# Patient Record
Sex: Female | Born: 1980 | Race: White | Hispanic: No | Marital: Married | Smoking: Former smoker
Health system: Southern US, Community
[De-identification: ages and names within clinical notes are randomized; demographics above are authoritative.]

## PROBLEM LIST (undated history)

## (undated) DIAGNOSIS — R87619 Unspecified abnormal cytological findings in specimens from cervix uteri: Secondary | ICD-10-CM

## (undated) DIAGNOSIS — T7840XA Allergy, unspecified, initial encounter: Secondary | ICD-10-CM

## (undated) DIAGNOSIS — M1990 Unspecified osteoarthritis, unspecified site: Secondary | ICD-10-CM

## (undated) DIAGNOSIS — E079 Disorder of thyroid, unspecified: Secondary | ICD-10-CM

## (undated) DIAGNOSIS — F1910 Other psychoactive substance abuse, uncomplicated: Secondary | ICD-10-CM

## (undated) DIAGNOSIS — F419 Anxiety disorder, unspecified: Secondary | ICD-10-CM

## (undated) DIAGNOSIS — IMO0002 Reserved for concepts with insufficient information to code with codable children: Secondary | ICD-10-CM

## (undated) DIAGNOSIS — K219 Gastro-esophageal reflux disease without esophagitis: Secondary | ICD-10-CM

## (undated) DIAGNOSIS — J449 Chronic obstructive pulmonary disease, unspecified: Secondary | ICD-10-CM

## (undated) HISTORY — DX: Unspecified osteoarthritis, unspecified site: M19.90

## (undated) HISTORY — DX: Other psychoactive substance abuse, uncomplicated: F19.10

## (undated) HISTORY — PX: TUBAL LIGATION: SHX77

## (undated) HISTORY — PX: DILATION AND CURETTAGE OF UTERUS: SHX78

## (undated) HISTORY — DX: Allergy, unspecified, initial encounter: T78.40XA

## (undated) HISTORY — DX: Anxiety disorder, unspecified: F41.9

## (undated) HISTORY — PX: DILATION AND EVACUATION: SHX1459

## (undated) HISTORY — DX: Unspecified abnormal cytological findings in specimens from cervix uteri: R87.619

## (undated) HISTORY — PX: LEEP: SHX91

## (undated) HISTORY — DX: Disorder of thyroid, unspecified: E07.9

## (undated) HISTORY — DX: Reserved for concepts with insufficient information to code with codable children: IMO0002

## (undated) HISTORY — DX: Chronic obstructive pulmonary disease, unspecified: J44.9

## (undated) HISTORY — DX: Gastro-esophageal reflux disease without esophagitis: K21.9

## (undated) HISTORY — PX: WISDOM TOOTH EXTRACTION: SHX21

---

## 2003-11-15 ENCOUNTER — Ambulatory Visit: Payer: Self-pay | Admitting: Internal Medicine

## 2004-02-24 ENCOUNTER — Emergency Department: Payer: Self-pay | Admitting: Emergency Medicine

## 2004-06-06 ENCOUNTER — Emergency Department: Payer: Self-pay | Admitting: Emergency Medicine

## 2004-06-12 ENCOUNTER — Emergency Department: Payer: Self-pay | Admitting: Emergency Medicine

## 2004-07-29 ENCOUNTER — Emergency Department: Payer: Self-pay | Admitting: General Practice

## 2004-10-31 ENCOUNTER — Emergency Department: Payer: Self-pay | Admitting: Emergency Medicine

## 2005-04-09 ENCOUNTER — Emergency Department: Payer: Self-pay | Admitting: Emergency Medicine

## 2005-05-30 ENCOUNTER — Emergency Department: Payer: Self-pay | Admitting: Emergency Medicine

## 2005-11-10 ENCOUNTER — Emergency Department: Payer: Self-pay | Admitting: Emergency Medicine

## 2005-11-10 ENCOUNTER — Emergency Department: Payer: Self-pay

## 2006-02-01 ENCOUNTER — Emergency Department: Payer: Self-pay | Admitting: Emergency Medicine

## 2006-02-02 ENCOUNTER — Emergency Department: Payer: Self-pay | Admitting: Internal Medicine

## 2006-02-17 ENCOUNTER — Emergency Department: Payer: Self-pay | Admitting: Emergency Medicine

## 2006-07-31 ENCOUNTER — Emergency Department: Payer: Self-pay

## 2006-10-02 ENCOUNTER — Ambulatory Visit: Payer: Self-pay | Admitting: Gynecology

## 2006-10-06 ENCOUNTER — Ambulatory Visit (HOSPITAL_COMMUNITY): Admission: RE | Admit: 2006-10-06 | Discharge: 2006-10-06 | Payer: Self-pay | Admitting: Gynecology

## 2006-10-22 ENCOUNTER — Encounter: Payer: Self-pay | Admitting: Obstetrics & Gynecology

## 2006-10-22 ENCOUNTER — Ambulatory Visit: Payer: Self-pay | Admitting: Obstetrics & Gynecology

## 2006-11-17 ENCOUNTER — Ambulatory Visit (HOSPITAL_COMMUNITY): Admission: RE | Admit: 2006-11-17 | Discharge: 2006-11-17 | Payer: Self-pay | Admitting: Gynecology

## 2006-12-10 ENCOUNTER — Ambulatory Visit: Payer: Self-pay | Admitting: Obstetrics & Gynecology

## 2007-01-13 ENCOUNTER — Ambulatory Visit: Payer: Self-pay | Admitting: Family Medicine

## 2007-01-13 LAB — CONVERTED CEMR LAB
HCT: 32.4 % — ABNORMAL LOW (ref 36.0–46.0)
Hemoglobin: 10.8 g/dL — ABNORMAL LOW (ref 12.0–15.0)
MCHC: 33.3 g/dL (ref 30.0–36.0)
Platelets: 216 10*3/uL (ref 150–400)
RDW: 12.8 % (ref 11.5–15.5)

## 2007-01-28 ENCOUNTER — Ambulatory Visit: Payer: Self-pay | Admitting: Obstetrics & Gynecology

## 2007-02-16 ENCOUNTER — Ambulatory Visit: Payer: Self-pay | Admitting: Gynecology

## 2007-03-05 ENCOUNTER — Ambulatory Visit: Payer: Self-pay | Admitting: Obstetrics & Gynecology

## 2007-03-09 ENCOUNTER — Inpatient Hospital Stay (HOSPITAL_COMMUNITY): Admission: AD | Admit: 2007-03-09 | Discharge: 2007-03-09 | Payer: Self-pay | Admitting: Obstetrics & Gynecology

## 2007-03-09 ENCOUNTER — Ambulatory Visit: Payer: Self-pay | Admitting: Obstetrics and Gynecology

## 2007-03-10 ENCOUNTER — Ambulatory Visit (HOSPITAL_COMMUNITY): Admission: RE | Admit: 2007-03-10 | Discharge: 2007-03-10 | Payer: Self-pay | Admitting: Gynecology

## 2007-04-01 ENCOUNTER — Ambulatory Visit: Payer: Self-pay | Admitting: Gynecology

## 2007-04-09 ENCOUNTER — Ambulatory Visit: Payer: Self-pay | Admitting: Obstetrics & Gynecology

## 2007-04-10 ENCOUNTER — Encounter: Payer: Self-pay | Admitting: Obstetrics and Gynecology

## 2007-04-10 ENCOUNTER — Inpatient Hospital Stay (HOSPITAL_COMMUNITY): Admission: RE | Admit: 2007-04-10 | Discharge: 2007-04-12 | Payer: Self-pay | Admitting: Obstetrics and Gynecology

## 2007-04-10 ENCOUNTER — Ambulatory Visit: Payer: Self-pay | Admitting: Obstetrics and Gynecology

## 2007-04-15 ENCOUNTER — Ambulatory Visit: Payer: Self-pay | Admitting: Obstetrics & Gynecology

## 2007-04-20 ENCOUNTER — Ambulatory Visit: Payer: Self-pay | Admitting: Obstetrics & Gynecology

## 2007-04-28 ENCOUNTER — Ambulatory Visit: Payer: Self-pay | Admitting: Family Medicine

## 2007-05-27 ENCOUNTER — Ambulatory Visit: Payer: Self-pay | Admitting: Obstetrics & Gynecology

## 2007-08-10 ENCOUNTER — Ambulatory Visit: Payer: Self-pay | Admitting: Gynecology

## 2007-08-17 ENCOUNTER — Ambulatory Visit (HOSPITAL_COMMUNITY): Admission: RE | Admit: 2007-08-17 | Discharge: 2007-08-17 | Payer: Self-pay | Admitting: Gynecology

## 2007-08-24 ENCOUNTER — Ambulatory Visit (HOSPITAL_COMMUNITY): Admission: RE | Admit: 2007-08-24 | Discharge: 2007-08-24 | Payer: Self-pay | Admitting: Gynecology

## 2007-08-24 ENCOUNTER — Ambulatory Visit: Payer: Self-pay | Admitting: Family Medicine

## 2007-08-25 ENCOUNTER — Ambulatory Visit: Payer: Self-pay | Admitting: Obstetrics and Gynecology

## 2007-08-27 ENCOUNTER — Ambulatory Visit: Payer: Self-pay | Admitting: Obstetrics & Gynecology

## 2007-08-27 ENCOUNTER — Inpatient Hospital Stay (HOSPITAL_COMMUNITY): Admission: AD | Admit: 2007-08-27 | Discharge: 2007-08-27 | Payer: Self-pay | Admitting: Obstetrics & Gynecology

## 2007-08-29 ENCOUNTER — Ambulatory Visit: Payer: Self-pay | Admitting: Obstetrics & Gynecology

## 2007-08-29 ENCOUNTER — Ambulatory Visit (HOSPITAL_COMMUNITY): Admission: RE | Admit: 2007-08-29 | Discharge: 2007-08-29 | Payer: Self-pay | Admitting: Obstetrics & Gynecology

## 2007-08-29 ENCOUNTER — Encounter: Payer: Self-pay | Admitting: Obstetrics & Gynecology

## 2007-09-16 ENCOUNTER — Ambulatory Visit: Payer: Self-pay | Admitting: Obstetrics and Gynecology

## 2007-10-08 ENCOUNTER — Ambulatory Visit: Payer: Self-pay | Admitting: Obstetrics & Gynecology

## 2007-10-12 ENCOUNTER — Inpatient Hospital Stay (HOSPITAL_COMMUNITY): Admission: AD | Admit: 2007-10-12 | Discharge: 2007-10-12 | Payer: Self-pay | Admitting: Gynecology

## 2007-10-13 ENCOUNTER — Ambulatory Visit: Payer: Self-pay | Admitting: Obstetrics & Gynecology

## 2007-11-18 ENCOUNTER — Ambulatory Visit: Payer: Self-pay | Admitting: Obstetrics and Gynecology

## 2007-11-18 ENCOUNTER — Encounter: Payer: Self-pay | Admitting: Obstetrics and Gynecology

## 2007-11-18 ENCOUNTER — Other Ambulatory Visit: Admission: RE | Admit: 2007-11-18 | Discharge: 2007-11-18 | Payer: Self-pay | Admitting: Obstetrics & Gynecology

## 2008-02-02 ENCOUNTER — Inpatient Hospital Stay (HOSPITAL_COMMUNITY): Admission: AD | Admit: 2008-02-02 | Discharge: 2008-02-02 | Payer: Self-pay | Admitting: Obstetrics & Gynecology

## 2008-03-31 ENCOUNTER — Ambulatory Visit: Payer: Self-pay | Admitting: Internal Medicine

## 2008-04-02 ENCOUNTER — Emergency Department: Payer: Self-pay | Admitting: Emergency Medicine

## 2008-09-09 ENCOUNTER — Ambulatory Visit: Payer: Self-pay | Admitting: Internal Medicine

## 2008-10-05 ENCOUNTER — Encounter: Payer: Self-pay | Admitting: Internal Medicine

## 2008-10-21 ENCOUNTER — Encounter: Payer: Self-pay | Admitting: Internal Medicine

## 2008-11-10 ENCOUNTER — Other Ambulatory Visit: Admission: RE | Admit: 2008-11-10 | Discharge: 2008-11-10 | Payer: Self-pay | Admitting: Obstetrics and Gynecology

## 2008-11-10 ENCOUNTER — Ambulatory Visit: Payer: Self-pay | Admitting: Obstetrics and Gynecology

## 2008-11-10 ENCOUNTER — Encounter: Payer: Self-pay | Admitting: Obstetrics and Gynecology

## 2008-11-21 ENCOUNTER — Encounter: Payer: Self-pay | Admitting: Internal Medicine

## 2008-11-24 ENCOUNTER — Ambulatory Visit: Payer: Self-pay | Admitting: Gastroenterology

## 2009-02-09 ENCOUNTER — Encounter: Payer: Self-pay | Admitting: Family Medicine

## 2009-02-09 ENCOUNTER — Ambulatory Visit: Payer: Self-pay | Admitting: Obstetrics & Gynecology

## 2009-02-09 LAB — CONVERTED CEMR LAB
Antibody Screen: NEGATIVE
Basophils Absolute: 0 10*3/uL (ref 0.0–0.1)
Basophils Relative: 0 % (ref 0–1)
Eosinophils Absolute: 0.1 10*3/uL (ref 0.0–0.7)
Eosinophils Relative: 1 % (ref 0–5)
HCT: 39.1 % (ref 36.0–46.0)
MCV: 93.3 fL (ref 78.0–100.0)
Neutrophils Relative %: 72 % (ref 43–77)
Platelets: 245 10*3/uL (ref 150–400)
RDW: 12.8 % (ref 11.5–15.5)
WBC: 10.8 10*3/uL — ABNORMAL HIGH (ref 4.0–10.5)

## 2009-02-21 ENCOUNTER — Other Ambulatory Visit: Admission: RE | Admit: 2009-02-21 | Discharge: 2009-02-21 | Payer: Self-pay | Admitting: Obstetrics & Gynecology

## 2009-02-21 ENCOUNTER — Ambulatory Visit: Payer: Self-pay | Admitting: Obstetrics & Gynecology

## 2009-03-10 ENCOUNTER — Emergency Department: Payer: Self-pay | Admitting: Emergency Medicine

## 2009-03-23 ENCOUNTER — Ambulatory Visit (HOSPITAL_COMMUNITY): Admission: RE | Admit: 2009-03-23 | Discharge: 2009-03-23 | Payer: Self-pay | Admitting: Obstetrics & Gynecology

## 2009-03-29 ENCOUNTER — Ambulatory Visit: Payer: Self-pay | Admitting: Obstetrics & Gynecology

## 2009-04-19 ENCOUNTER — Encounter: Payer: Self-pay | Admitting: Family Medicine

## 2009-04-19 ENCOUNTER — Ambulatory Visit: Payer: Self-pay | Admitting: Obstetrics & Gynecology

## 2009-04-19 LAB — CONVERTED CEMR LAB
Chlamydia, DNA Probe: NEGATIVE
GC Probe Amp, Genital: NEGATIVE

## 2009-04-20 ENCOUNTER — Encounter: Payer: Self-pay | Admitting: Family Medicine

## 2009-04-20 LAB — CONVERTED CEMR LAB: Trich, Wet Prep: NONE SEEN

## 2009-04-27 ENCOUNTER — Ambulatory Visit: Payer: Self-pay | Admitting: Obstetrics & Gynecology

## 2009-05-03 ENCOUNTER — Ambulatory Visit (HOSPITAL_COMMUNITY): Admission: RE | Admit: 2009-05-03 | Discharge: 2009-05-03 | Payer: Self-pay | Admitting: Obstetrics & Gynecology

## 2009-05-04 ENCOUNTER — Ambulatory Visit: Payer: Self-pay | Admitting: Obstetrics & Gynecology

## 2009-05-24 ENCOUNTER — Ambulatory Visit: Payer: Self-pay | Admitting: Obstetrics and Gynecology

## 2009-06-21 ENCOUNTER — Ambulatory Visit: Payer: Self-pay | Admitting: Family Medicine

## 2009-07-19 ENCOUNTER — Encounter: Payer: Self-pay | Admitting: Family Medicine

## 2009-07-19 ENCOUNTER — Ambulatory Visit: Payer: Self-pay | Admitting: Obstetrics & Gynecology

## 2009-07-19 LAB — CONVERTED CEMR LAB
HCT: 34.2 % — ABNORMAL LOW (ref 36.0–46.0)
Hemoglobin: 10.7 g/dL — ABNORMAL LOW (ref 12.0–15.0)
Platelets: 216 10*3/uL (ref 150–400)
RBC: 3.5 M/uL — ABNORMAL LOW (ref 3.87–5.11)
WBC: 12.3 10*3/uL — ABNORMAL HIGH (ref 4.0–10.5)

## 2009-07-20 ENCOUNTER — Encounter: Payer: Self-pay | Admitting: Family Medicine

## 2009-07-20 LAB — CONVERTED CEMR LAB

## 2009-08-02 ENCOUNTER — Ambulatory Visit (HOSPITAL_COMMUNITY): Admission: RE | Admit: 2009-08-02 | Discharge: 2009-08-02 | Payer: Self-pay | Admitting: Obstetrics & Gynecology

## 2009-08-10 ENCOUNTER — Ambulatory Visit: Payer: Self-pay | Admitting: Obstetrics and Gynecology

## 2009-08-31 ENCOUNTER — Ambulatory Visit: Payer: Self-pay | Admitting: Obstetrics & Gynecology

## 2009-09-06 ENCOUNTER — Ambulatory Visit: Payer: Self-pay | Admitting: Obstetrics and Gynecology

## 2009-09-06 ENCOUNTER — Encounter: Payer: Self-pay | Admitting: Family Medicine

## 2009-09-06 LAB — CONVERTED CEMR LAB
Chlamydia, DNA Probe: NEGATIVE
GC Probe Amp, Genital: NEGATIVE

## 2009-09-07 ENCOUNTER — Encounter: Payer: Self-pay | Admitting: Family Medicine

## 2009-09-14 ENCOUNTER — Ambulatory Visit: Payer: Self-pay | Admitting: Obstetrics & Gynecology

## 2009-09-20 ENCOUNTER — Ambulatory Visit: Payer: Self-pay | Admitting: Obstetrics & Gynecology

## 2009-09-26 ENCOUNTER — Ambulatory Visit: Payer: Self-pay | Admitting: Obstetrics & Gynecology

## 2009-09-26 ENCOUNTER — Inpatient Hospital Stay (HOSPITAL_COMMUNITY): Admission: RE | Admit: 2009-09-26 | Discharge: 2009-09-28 | Payer: Self-pay | Admitting: Obstetrics & Gynecology

## 2009-09-30 ENCOUNTER — Inpatient Hospital Stay (HOSPITAL_COMMUNITY): Admission: AD | Admit: 2009-09-30 | Discharge: 2009-09-30 | Payer: Self-pay | Admitting: Family Medicine

## 2009-11-07 ENCOUNTER — Ambulatory Visit: Payer: Self-pay | Admitting: Family Medicine

## 2009-11-14 ENCOUNTER — Ambulatory Visit: Payer: Self-pay | Admitting: Obstetrics & Gynecology

## 2009-12-12 ENCOUNTER — Ambulatory Visit: Payer: Self-pay | Admitting: Family Medicine

## 2010-02-12 ENCOUNTER — Ambulatory Visit: Admit: 2010-02-12 | Payer: Self-pay | Admitting: Obstetrics & Gynecology

## 2010-03-06 ENCOUNTER — Ambulatory Visit: Payer: Medicaid Other | Admitting: Obstetrics and Gynecology

## 2010-03-06 ENCOUNTER — Other Ambulatory Visit (HOSPITAL_COMMUNITY)
Admission: RE | Admit: 2010-03-06 | Discharge: 2010-03-06 | Disposition: A | Discharge status: Home / Self Care | Payer: Medicaid Other | Source: Ambulatory Visit | Attending: Family Medicine | Admitting: Family Medicine

## 2010-03-06 DIAGNOSIS — Z01419 Encounter for gynecological examination (general) (routine) without abnormal findings: Secondary | ICD-10-CM

## 2010-03-06 DIAGNOSIS — Z30432 Encounter for removal of intrauterine contraceptive device: Secondary | ICD-10-CM

## 2010-04-05 LAB — CBC
Hemoglobin: 10.3 g/dL — ABNORMAL LOW (ref 12.0–15.0)
MCH: 32.8 pg (ref 26.0–34.0)
MCHC: 34.1 g/dL (ref 30.0–36.0)
RDW: 13.2 % (ref 11.5–15.5)

## 2010-04-05 LAB — TYPE AND SCREEN: ABO/RH(D): A POS

## 2010-04-06 LAB — URINALYSIS, ROUTINE W REFLEX MICROSCOPIC
Bilirubin Urine: NEGATIVE
Glucose, UA: NEGATIVE mg/dL
Ketones, ur: NEGATIVE mg/dL
pH: 6.5 (ref 5.0–8.0)

## 2010-04-06 LAB — CBC
HCT: 35.4 % — ABNORMAL LOW (ref 36.0–46.0)
Hemoglobin: 11.9 g/dL — ABNORMAL LOW (ref 12.0–15.0)
MCH: 32.6 pg (ref 26.0–34.0)
MCHC: 33.6 g/dL (ref 30.0–36.0)

## 2010-04-06 LAB — SURGICAL PCR SCREEN: Staphylococcus aureus: POSITIVE — AB

## 2010-04-13 NOTE — Assessment & Plan Note (Signed)
NAME:  [name], [name] NO.:  [id number]  MEDICAL RECORD NO.:  [id number]           PATIENT TYPE:  LOCATION:  [facility] at [facility]           FACILITY:  PHYSICIAN:  Tinnie Gens, MD        DATE OF BIRTH:  1981/01/03  DATE OF SERVICE:  03/06/2010                                 CLINIC NOTE  CHIEF COMPLAINT:  Yearly exam with IUD check.  HISTORY OF PRESENT ILLNESS:  The patient is a 30 year old gravida 6, para 3-0-3-3 who had an IUD placed in October and continued to have fairly significant painful cramping with her periods.  She would like to have her IUD removed.  She does not want to get pregnant.  She is not interested in any other sort of birth control, all of which were reviewed with her today.  The patient additionally was strongly opposed to  [facility] and Depo-Provera.  PAST MEDICAL HISTORY:  Significant for obesity.  PAST SURGICAL HISTORY:  C-section x3.  MEDICATIONS:  She is on Nexium b.i.d., multivitamin daily, and Mirena IUD.  ALLERGIES:  SULFA.  No latex allergy.  SOCIAL HISTORY:  Positive tobacco.  No drugs.  Rare alcohol use.  FAMILY HISTORY:  Essentially negative.  OBSTETRICAL HISTORY:  She is a G6, P3 with 3 previous C-sections and 3 miscarriages.  GYNECOLOGIC HISTORY:  History of abnormal Pap smear with a previous LEEP.  Her last Pap was in February of last year and was normal.  REVIEW OF SYSTEMS:  Reviewed.  She denies headache, vision changes, abdominal pain, nausea, vomiting, diarrhea, constipation, chest pain, shortness of breath, blood in her stool, blood in her urine, swelling in her feet or ankles, breast masses.  PHYSICAL EXAMINATION:  VITAL SIGNS:  As noted in the chart. GENERAL:  She is a well-developed, well-nourished female in no acute distress. HEENT:  Normocephalic, atraumatic.  Sclerae anicteric. NECK:  Supple.  Normal thyroid. LUNGS:  Clear bilaterally. CARDIOVASCULAR:  Regular rate and rhythm.  No rubs, gallops, or  murmurs. ABDOMEN:  Soft, nontender, nondistended. EXTREMITIES:  No cyanosis, clubbing, or edema. BREASTS:  Symmetric with everted nipples.  No masses.  No supraclavicular or axillary adenopathy GENITOURINARY:  Normal external female genitalia.  BUS normal.  Vagina is pink and rugated.  Cervix is nulliparous without lesion.  Uterus is small, anteverted.  No adnexal mass or tenderness, although exam is somewhat limited by body habitus.  PROCEDURE:  IUD strings are visualized, grasped with a straight Kelly clamp and removed without difficulty.  NuvaRing was placed today as well and instructions were given to the patient.  She was given this one plus another sample to take home.  IMPRESSION: 1. Yearly exam, desire for IUD removal and further birth control. 2. Smoking.  PLAN: 1. I advised the patient she is allowed NuvaRing only until age 31. 2. Pap smear today. 3. Follow up in 2-3 months to see how this birth control method is     working for her and if it is, we can write her prescription or call     her in.          ______________________________ Tinnie Gens, MD    TP/MEDQ  D:  03/06/2010  T:  03/07/2010  Job:  [id number]

## 2010-04-24 ENCOUNTER — Ambulatory Visit: Payer: Medicaid Other | Admitting: Obstetrics and Gynecology

## 2010-04-24 DIAGNOSIS — Z3009 Encounter for other general counseling and advice on contraception: Secondary | ICD-10-CM

## 2010-05-07 LAB — URINALYSIS, ROUTINE W REFLEX MICROSCOPIC
Glucose, UA: NEGATIVE mg/dL
Specific Gravity, Urine: <1.005 — ABNORMAL LOW (ref 1.005–1.030)
pH: 7 (ref 5.0–8.0)

## 2010-05-07 LAB — CBC
HCT: 40.7 % (ref 36.0–46.0)
MCV: 95.5 fL (ref 78.0–100.0)
Platelets: 209 10*3/uL (ref 150–400)
RDW: 12.9 % (ref 11.5–15.5)
WBC: 9.9 10*3/uL (ref 4.0–10.5)

## 2010-05-07 LAB — GC/CHLAMYDIA PROBE AMP, GENITAL: Chlamydia, DNA Probe: NEGATIVE

## 2010-05-07 LAB — URINE MICROSCOPIC-ADD ON

## 2010-05-07 LAB — WET PREP, GENITAL: Yeast Wet Prep HPF POC: NONE SEEN

## 2010-06-05 ENCOUNTER — Other Ambulatory Visit: Payer: Self-pay | Admitting: Family Medicine

## 2010-06-05 ENCOUNTER — Encounter (HOSPITAL_COMMUNITY): Payer: Medicaid Other

## 2010-06-05 LAB — CBC
MCH: 29.4 pg (ref 26.0–34.0)
MCV: 91.2 fL (ref 78.0–100.0)
Platelets: 206 10*3/uL (ref 150–400)
RDW: 13.1 % (ref 11.5–15.5)
WBC: 7.5 10*3/uL (ref 4.0–10.5)

## 2010-06-05 NOTE — Assessment & Plan Note (Signed)
NAME:  [name], [name]                  ACCOUNT NO.:  [id number]   MEDICAL RECORD NO.:  [id number]          PATIENT TYPE:  POB   LOCATION:  [facility] at [facility]         FACILITY:  [facility]   PHYSICIAN:  Tinnie Gens, MD        DATE OF BIRTH:  06-11-1980   DATE OF SERVICE:  04/28/2007                                  CLINIC NOTE   CHIEF COMPLAINT:  Incision check and depression.   HISTORY OF PRESENT ILLNESS:  The patient is a 30 year old gravida 4,  para 2-0-2-2 who is approximately 16 or 17 days post elective repeat C-  section who comes in with complaints of incision draining.  She is  without pain and she reports incision itches. There is no fever, chills  or redness  at the incision.   The patient also complains postpartum depression.  She reports crying  all the time, being lonely, being home with her child and wanting  medication for treatment.  She does not have suicidal ideation or  hearing voices.   PHYSICAL EXAMINATION:  VITAL SIGNS: Are as in the chart.  She well-  developed, well-nourished female in no acute distress.  Affect is  somewhat flat.  She appears appropriate.  Incision granulation tissue is  present for approximately one-half of the incision from the midline to  the right side. This was treated with silver nitrate.   IMPRESSION:  1. Granulation tissue associated with C-section incision, status post      treatment.  2. Postpartum depression.   PLAN:  1. For a follow up 1 week for incision check.  2. Start Lexapro 10 mg one p.o. daily.  Follow up 4 weeks for recheck      of that with her six week checkup.           ______________________________  Tinnie Gens, MD     TP/MEDQ  D:  04/28/2007  T:  04/28/2007  Job:  [id number]

## 2010-06-05 NOTE — Op Note (Signed)
NAMECHELLY, [name]                  ACCOUNT NO.:  [id number]   MEDICAL RECORD NO.:  [id number]           PATIENT TYPE:   LOCATION:                                 FACILITY:   PHYSICIAN:  Norton Blizzard, MD    DATE OF BIRTH:  1980/03/04   DATE OF PROCEDURE:  08/29/2007  DATE OF DISCHARGE:                               OPERATIVE REPORT   PREOPERATIVE DIAGNOSIS:  Missed abortion at 7 weeks' gestation.   POSTOPERATIVE DIAGNOSIS:  Missed abortion at 7 weeks' gestation.   PROCEDURE:  Dilation and evacuation.   SURGEON:  Norton Blizzard, MD   ANESTHESIA:  MAC, paracervical block using 20 mL of 1% Nesacaine.   INTRAVENOUS FLUIDS:  1100 mL of lactated Ringer's.   ESTIMATED BLOOD LOSS:  50 mL.   INDICATIONS:  The patient is a 30 year old gravida 5, para 2-0-3-2 who  was diagnosed with missed AB at 7 weeks.  The patient was given the  options of expected management versus misoprostol administration versus  surgical management with dilation and evacuation, and opted for a  dilation and evacuation.  The risks of surgery including bleeding,  infection, injury to surrounding organs, and possibility of retained  products with the risk of needing additional procedures were discussed  with the patient and written informed consent was obtained.   FINDINGS:  A 10-week size anteverted uterus sounded to 10 cm, normal  adnexa bilaterally.  Moderate amount of products of conception.   SPECIMENS:  Products of conception.   DISPOSITION:  All specimens to pathology.   COMPLICATIONS:  None.   PROCEDURE DETAILS:  The patient received preoperative IV antibiotics in  the form of doxycycline in the preoperative area.  She was taken to the  operating room where MAC was administered and found to be adequate.  The  patient was then placed in the dorsal lithotomy position and prepped and  draped in the sterile manner.  Attention was then turned to the  patient's pelvis where a vaginal speculum was  placed, and her cervix was  noted to be closed.  A tenaculum was placed in the anterior lip of the  cervix and the uterus was noted to sound to 11 cm.  A paracervical block  was then administered using 20% of 1% Nesacaine.  The cervix was dilated  to accommodate a 7-mm curved curette.  The curette was gently advanced  into the uterine fundus and attached to the suction device.  The suction  was then activated, and this curette was slowly rotated to clear the  uterus of all contents.  The uterus was noted to contract down with  complete evacuation of products of conception.  After the suction  curettage, a sharp curettage was also done to confirm complete  evacuation of products from the uterus.  Overall, good hemostasis was  noted.  The tenaculum was then removed from the patient's cervix and good  hemostasis was reconfirmed.  All instruments were then removed from the  patient's vagina.  The patient tolerated the procedure well.  Sponge,  needle, and instrument counts  were correct x2.  She was taken to the  recovery room awake and in stable condition.      Norton Blizzard, MD  Electronically Signed     UAD/MEDQ  D:  08/29/2007  T:  08/29/2007  Job:  [phone]

## 2010-06-05 NOTE — Assessment & Plan Note (Signed)
NAME:  [name], [name] NO.:  [id number]   MEDICAL RECORD NO.:  [id number]          PATIENT TYPE:  POB   LOCATION:  [facility] at [facility]         FACILITY:  [facility]   PHYSICIAN:  Tinnie Gens, MD        DATE OF BIRTH:  [birth year]   DATE OF SERVICE:                                  CLINIC NOTE   CHIEF COMPLAINT:  IUD check.   HISTORY OF PRESENT ILLNESS:  The patient is a 30 year old gravida 6,  para 3-0-3-3, who had an IUD inserted on November 14, 2009.  She comes in  today for IUD string check.  She reports that since having her IUD  placed, she has had spotting pretty much the whole time with some fairly  significant cramping that has been going on since her spotting started  about 2 weeks ago and she desires some treatment for this.  Otherwise,  she is without significant complaints.  She did try to check a feel for  IUD strings however she is unable to feel them.   PHYSICAL EXAMINATION:  VITAL SIGNS:  Her vitals are as noted in the  chart.  GENERAL:  She is a well-developed, well-nourished female in no acute  distress.  ABDOMEN:  Soft, nontender, nondistended.  GU:  Normal external female genitalia.  BUS is normal.  Vagina is pink  and rugated.  Cervix is parous without lesion.  IUD strings are  visualized and are softened and tucked up behind the cervix.   IMPRESSION:  1. Intrauterine in appropriate position.  2. Spotting secondary to above with cramping.   PLAN:  One pack of Femcon Fe is given to the patient to totally stop  bleeding and cramping.  She will return in February of next year for  physical exam.           ______________________________  Tinnie Gens, MD     TP/MEDQ  D:  12/12/2009  T:  12/13/2009  Job:  [id number]

## 2010-06-05 NOTE — Assessment & Plan Note (Signed)
NAME:  [name], [name]                  ACCOUNT NO.:  [id number]   MEDICAL RECORD NO.:  [id number]          PATIENT TYPE:  POB   LOCATION:  [facility] at [facility]         FACILITY:  [facility]   PHYSICIAN:  Argentina Donovan, MD        DATE OF BIRTH:  Oct 20, 1980   DATE OF SERVICE:  11/18/2007                                  CLINIC NOTE   The patient is a 30 year old Caucasian female gravida 5, para 2-0-3 who  had a D&E for a missed AB on August 29, 2007.  She came in on October 13, 2007, and got an injection of Depo-Provera, which she is using for  contraception at this point and now returns today on November 18, 2007,  for her yearly exam.  I have encouraged her to take vitamin D with  calcium 1200 mg a day of the calcium and given a prescription for that  and we will get that started today.   PHYSICAL EXAMINATION:  VITAL SIGNS:  Her blood pressure is 108/69 and  her weight is 162.  She 5 feet 5-1/2 inches tall.  Her pulse is 95 per  minute.  HEENT:  Within normal limits.  NECK:  Supple.  Thyroid is symmetrical with no masses.  BREASTS:  Symmetrical.  No nipple discharge.  No supraclavicular or  axillary nodes noted.  ABDOMEN:  Soft and nontender.  No masses or organomegaly.  GENITALIA:  External is normal.  BUS within normal limits.  Vagina is  clean and well rugated.  The cervix is clean and parous.  Pap smear was  taken.  The adnexa is normal.  Cul-de-sac is free.  EXTREMITIES:  No edema.  No varices.  DTRs are within normal limits.   IMPRESSION:  Normal gynecological examination.  The patient on Depo-  Provera, to return in mid December for another injection and then  encouraged to increase her calcium and vitamin D intake.           ______________________________  Argentina Donovan, MD     PR/MEDQ  D:  11/18/2007  T:  11/19/2007  Job:  [id number]

## 2010-06-05 NOTE — Discharge Summary (Signed)
NAME:  [name], [name]                  ACCOUNT NO.:  [id number]   MEDICAL RECORD NO.:  [id number]          PATIENT TYPE:  INP   LOCATION:                                FACILITY:  [facility]   PHYSICIAN:  Lesly Dukes, M.D. DATE OF BIRTH:  [birth year]   DATE OF ADMISSION:  04/09/2007  DATE OF DISCHARGE:  04/12/2007                               DISCHARGE SUMMARY   HISTORY:  The patient is a 30 year old gravida 4, now para 2 who came in  for a repeat C-section at 39 weeks.   MEDICAL HISTORY:  Negative.   GYNECOLOGICAL HISTORY:  Had a LEEP in the past.   OBSTETRICAL HISTORY:  Prior C-section and a D and C for a miscarriage.   SOCIAL HISTORY:  The patient smokes 1 pack per day.   PERTINENT LABS:  The patient is A positive, antibody negative and GBS  negative.   The patient had a repeat low-transverse uterine C-section on April 10, 2007, viable female born with weight 6 pounds 7 ounces with Apgars 9 and  9.  Cord pH normal.  Estimated blood loss 700 mL.  No complications  throughout procedure.  Today, the patient reports no problems, desires  discharged home and positive flatus, no bowel movement, no calf pain, no  shortness of breath.  Pain well controlled.   PHYSICAL EXAMINATION:  VITAL SIGNS:  Temperature 98, Pulse 96,  respirations 20, and blood pressure 111/74.  LUNGS:  Clear throughout auscultation bilaterally.  CVS:  Regular rate and rhythm without murmur, gallops, or rubs.  ABDOMEN:  Abdominal incision site without signs of infection, no  redness, no abnormal discharge, no odor.  Bowel sounds x 4.  Fundus  firm, below the umbilicus.  EXTREMITIES:  2+ bilateral pedal edema.  No Homans.   ASSESSMENT:  12. 66.  A 31 year old gravida 4, para 2, status post repeat low-      transverse C-section at 39 weeks.  2  Normal exam postop day #2, status post low-transverse C-section.   PLAN:  The patient is to discharge to home.  The patient was given  postpartum precaution, is to return  for staple removal in 5-7 days,  normal diet, no heavy lifting past 10 pounds.   Prescription for Percocet 5/325 1-2 tablets q. 4-6 h. p.r.n. and follow  up as needed.      Sid Falcon, CNM      Lesly Dukes, M.D.  Electronically Signed    WM/MEDQ  D:  04/12/2007  T:  04/12/2007  Job:  [id number]

## 2010-06-05 NOTE — Op Note (Signed)
NAME:  [name], [name]                  ACCOUNT NO.:  [id number]   MEDICAL RECORD NO.:  [id number]          PATIENT TYPE:  INP   LOCATION:                                FACILITY:  [facility]   PHYSICIAN:  Phil D. Okey Dupre, M.D.     DATE OF BIRTH:  [birth year]   DATE OF PROCEDURE:  04/10/2007  DATE OF DISCHARGE:  04/12/1907                               OPERATIVE REPORT   PREOPERATIVE DIAGNOSES:  1. Intrauterine pregnancy at 39 weeks, 0 days gestation.  2. History of previous Cesarean section x1 with desired repeat      Cesarean section.  3. Group B Streptococcus negative.   POSTOPERATIVE DIAGNOSES:  1. Intrauterine pregnancy at 39 weeks, 0 days gestation.  2. History of previous Cesarean section x1 with desired repeat      Cesarean section.  3. Group B Streptococcus negative.   PROCEDURE:  Repeat low transverse Cesarean section.   SURGEON:  Javier Glazier. Okey Dupre, MD   ASSISTANT:  Karlton Lemon, MD   ANESTHESIA:  Spinal.   FINDINGS:  1. Viable infant female weighing 6 pounds 7 ounces with Apgar 9 at 1      minute, 9 at 5 minutes, in vertex presentation.  2. Clear amniotic fluid.  3. Normal female pelvic anatomy.   ESTIMATED BLOOD LOSS:  700 mL.   DRAINS:  Foley with clear, yellow urine.   COMPLICATIONS:  None immediate.   SPECIMENS:  Placenta to Labor and Delivery.  Cord pH to lab.  Cord blood  to cord bank.   INDICATION FOR PROCEDURE:  This is a gravida 4, para 1-0-2-1, at 39  weeks and 0 days gestation with history of previous C-section x1.  She  had considered trial of labor after Cesarean, but elected to have repeat  Cesarean section.   DESCRIPTION OF PROCEDURE:  The patient was taken to the operating room  and after obtaining adequate spinal anesthesia, was prepped and draped  in the usual sterile manner in the supine position with left lateral  uterine displacement.  After insuring adequate anesthesia, a  Pfannenstiel skin incision was made using the scalpel.  This incision  was  carried down through the subcutaneous tissues using the scalpel.  The rectus fascia was nicked in the midline and incision was extended  laterally in each direction using the Mayo scissors.  The rectus muscles  were dissected free of the fascia using both sharp and blunt dissection.  The rectus muscles were separated sharply with Mayo scissors.  The  parietoperitoneum was identified, grasped between 2 hemostats and  elevated and incised with Metzenbaum scissors under direct  visualization.  The parietoperitoneum was further broken down with  Metzenbaum scissors.  Bladder blade was placed, and a reflection of the  visceroperitoneum superior to the bladder was identified, elevated, and  incised using the Metzenbaum scissors.  The incision was then extended  laterally in each direction.  Bladder flap was created using blunt  dissection and retracted with a bladder blade.  A low transverse uterine  incision was made using the scalpel and incision was  extended laterally  and superiorly using blunt dissection.  The amniotic membranes were then  ruptured with hemostat.  Clear amniotic fluid was noted.  Hand was  placed within the uterine cavity and used to elevate and flex the head  of the infant, which was delivered without difficulty.  The infant was  bulb-suctioned after delivery of the head.  The body of the infant was  then delivered without difficulty.  The cord was doubly clamped and cut,  and the infant was handed to the nursery team in attendance.  Specimens  were collected for cord pH, which returned 7.32.  The placenta was  delivered manually and appeared intact.  The endometrial cavity was then  wiped free of any trace of membranes using wet laparotomy sponges.  The  edges of the uterine incision were grasped with ring clamps, and the  uterine incision was then closed in 1 layer of running locking stitch of  0 Vicryl.  The uterine incision was inspected and found to have adequate   hemostasis.  The rectus muscles were inspected and small areas of  bleeding controlled using the Bovie cautery.  Copious amounts of  irrigation were used within the uterine cavity and the uterine incision  was inspected once more and found to have good hemostasis.  The rectus  fascia was then reapproximated with 1 suture of 0 Vicryl in a running,  unlocked fashion.  Small areas of bleeding of the subcutaneous tissue  were controlled using the Bovie cautery.  The skin was reapproximated  with stainless steel skin staples.  Sponge, needle, and instrument  counts were correct x2.  The patient tolerated the procedure well and  went to the Post Anesthesia Care Unit in stable condition.      Karlton Lemon, MD  Electronically Signed     ______________________________  Javier Glazier. Okey Dupre, M.D.    NS/MEDQ  D:  04/10/2007  T:  04/10/2007  Job:  [id number]

## 2010-06-05 NOTE — Assessment & Plan Note (Signed)
NAME:  [name], [name] NO.:  [id number]   MEDICAL RECORD NO.:  [id number]          PATIENT TYPE:  POB   LOCATION:  [facility]         [facility]ACILITY[facility]   PHYSICIAN:  Allie Bossier, MD        DATE OF BIRTH:  [birth year]   DATE OF SERVICE:  11/14/2009                                  CLINIC NOTE   PREOPERATIVE DIAGNOSIS:  Multiparity desires sterility temporarily.   POSTOPERATIVE DIAGNOSIS:  Multiparity desires sterility temporarily.   PROCEDURE:  Mirena intrauterine (contraceptive) device insertion.   COMPLICATIONS:  None.   ESTIMATED BLOOD LOSS:  Minimal.   DETAILED PROCEDURE AND FINDINGS:  The risk, benefits, alternatives, and  failure rate of the Mirena were explained, understood, and accepted.  Consents were signed.  Her urine pregnancy test was negative.  A  speculum was placed and the cervix was prepped with Betadine.  The  anterior lip of the cervix was grasped with single-tooth tenaculum and  the uterus was sounded to 10 cm.  The Mirena IUD was then inserted  without difficulty.  The strings were cut to 3 cm.  The tenaculum was  removed.  There was a small amount of oozing from the tenaculum site and  this was treated with silver nitrate, healing excellent hemostasis.  She  tolerated the procedure well.  She will come back in a month for string  check and her annual exam is due in March 2012.      Allie Bossier, MD     MCD/MEDQ  D:  11/14/2009  T:  11/15/2009  Job:  [id number]

## 2010-06-05 NOTE — Assessment & Plan Note (Signed)
NAME:  ROLLANDE, THURSBY NO.:  [id number]   MEDICAL RECORD NO.:  [id number]          PATIENT TYPE:  POB   LOCATION:  [facility]         FACILITY:  [facility]   PHYSICIAN:  Argentina Donovan, MD        DATE OF BIRTH:  1980/09/11   DATE OF SERVICE:  11/10/2008                                  CLINIC NOTE   The patient is a 30 year old white female gravida 5, para 2-0-3-2, who  has been on Depo-Provera for several years and came off in August with  the intention of conceiving.  She had not started on folic acid as yet.  We discussed that with her and told her how important it was.  She had  her first episode of onset of pain and not had any bleeding up until  about 2 weeks ago where she had a sharp onset of lower abdominal pain,  which was stabbing across her whole abdomen.  The pain started to abate  and then got worse with the onset of cramping, nausea, vomiting and  beginning to slightly bleed.  My impression is that she is now since she  had been on Provera so long, probably had an episode of mittelschmerz  followed by now the onset of her period, where in the past she has  always had GI symptoms along with severe cramps with her period.  She  cannot take ibuprofen, which I told her probably would be the best thing  to control the cause of this, so I am going to give her little Percocet  to handle these until she does conceive.  I have given her 5 mg #40 and  we started her on [employer] [facility] prenatal vitamins.  I have told her we are  probably going to have put up with this kind of thing without the pain  in the middle of the month and probably except for this time, but with  the symptoms that she generally having a period until she does conceive  and it is important that I told her that since she has had 3  miscarriages already, that she does start on the folic acid right away.   IMPRESSION:  Abdominal pain, probably secondary to mittelschmerz with  onset of first  period after a long-term Depo-Provera with patient the  history of dysmenorrhea and GI symptoms with her period.           ______________________________  Argentina Donovan, MD     PR/MEDQ  D:  11/10/2008  T:  11/11/2008  Job:  [id number]

## 2010-06-05 NOTE — Assessment & Plan Note (Signed)
NAME:  [name], [name]                  ACCOUNT NO.:  [id number]   MEDICAL RECORD NO.:  [id number]          PATIENT TYPE:  POB   LOCATION:  [facility] at [facility]         FACILITY:  [facility]   PHYSICIAN:  Argentina Donovan, MD        DATE OF BIRTH:  07/11/1980   DATE OF SERVICE:                                  CLINIC NOTE   The patient is a 30 year old Caucasian female gravida 5, para 2-0-2-0  who comes in at 7-1/[redacted] weeks gestation having had 2 ultrasounds a week  apart which showed a yolk sac, but no significant growth and no fetal  heart beat at this time.  Her last quantitative beta which was on the  third was 30,002.  She is unable to come in tomorrow for another one, so  we are going to have her come in 48 hours from now for followup.  She is  having severe cramping, but no sign of bleeding.   On examination, the uterus is slightly enlarged and quite tender.   My impression is we have probably pending inevitable loss of a failed  pregnancy.  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  She had  no morning sickness, no fatigue, no loss of appetite, and that is pretty  much what is going on now, but she did have significant symptoms in the  2 successful pregnancies.  My impression is early intrauterine  gestation, probably missed abortion.  I talked to the patient and she  prefers dilatation and curettage as a final for this unless she started  on once we determine if this is a failed pregnancy.           ______________________________  Argentina Donovan, MD     PR/MEDQ  D:  08/25/2007  T:  08/26/2007  Job:  [id number]

## 2010-06-05 NOTE — Assessment & Plan Note (Signed)
NAME:  [name], [name] NO.:  [id number]   MEDICAL RECORD NO.:  [id number]          PATIENT TYPE:  POB   LOCATION:  [facility] at [facility]         FACILITY:  [facility]   PHYSICIAN:  Tinnie Gens, MD        DATE OF BIRTH:  08-02-1980   DATE OF SERVICE:  11/07/2009                                  CLINIC NOTE   CHIEF COMPLAINT:  Postpartum check.   HISTORY OF PRESENT ILLNESS:  The patient is a 30 year old gravida 6,  para 3-0-3-3 who underwent a third repeat cesarean section on September 26, 2009, she is here today for postpartum check.  She reports her baby  girl is doing well.  She is bottle feeding.  The patient has not had  resumed intercourse yet and desires IUD.  The baby is doing fairly well,  voids approximately one time per night.  She had some issues with just  not feeling well and postpartum depression early on the first 2 weeks  postpartum, but now feeling well and has no need for any other further  intervention.  She has not resumed menses yet.   PHYSICAL EXAMINATION:  VITAL SIGNS:  As noted in the chart.  GENERAL:  She is a well-developed, well-nourished female in no acute  distress.  ABDOMEN:  Soft, nontender, nondistended.  Incision is well-healed.  GU:  Normal external female genitalia.  BUS is normal.  Uterus is small  and involuted.   IMPRESSION:  Postpartum check, doing well.   PLAN:  Mirena IUD and then a couple of days condoms as a backup for the  next month after insertion.  Refrain from intercourse until IUD is  inserted.  Follow up in February of next year for Pap.           ______________________________  Tinnie Gens, MD     TP/MEDQ  D:  11/07/2009  T:  11/08/2009  Job:  [id number]

## 2010-06-12 ENCOUNTER — Ambulatory Visit (HOSPITAL_COMMUNITY)
Admission: RE | Admit: 2010-06-12 | Discharge: 2010-06-12 | Disposition: A | Discharge status: Home / Self Care | Payer: Medicaid Other | Source: Ambulatory Visit | Attending: Family Medicine | Admitting: Family Medicine

## 2010-06-12 DIAGNOSIS — Z641 Problems related to multiparity: Secondary | ICD-10-CM

## 2010-06-12 DIAGNOSIS — Z302 Encounter for sterilization: Secondary | ICD-10-CM

## 2010-06-12 DIAGNOSIS — Z01818 Encounter for other preprocedural examination: Secondary | ICD-10-CM

## 2010-06-12 HISTORY — PX: TUBAL LIGATION: SHX77

## 2010-06-19 NOTE — Op Note (Signed)
NAMENITI, LEISURE                 ACCOUNT NO.:  [id number]  MEDICAL RECORD NO.:  [id number]           PATIENT TYPE:  O  LOCATION:  [facility]                          FACILITY:  [facility]  PHYSICIAN:  Fatimata Talsma S. Shawnie Pons, M.D.   DATE OF BIRTH:  Dec 17, 1980  DATE OF PROCEDURE:  06/12/2010 DATE OF DISCHARGE:                              OPERATIVE REPORT   PREOPERATIVE DIAGNOSIS:  Multiparity and undesired fertility.  POSTOPERATIVE DIAGNOSIS:  Multiparity and undesired fertility.  PROCEDURE:  Laparoscopic BTL, Filshie clip.  SURGEON:  Shelbie Proctor. Shawnie Pons, MD.  ASSISTANT:  None.  ANESTHESIA:  General local, Dr. Cristela Blue.  FINDINGS:  Normal-appearing tubes and right ovary.  Left ovary was not visualized.  SPECIMENS:  None.  BLOOD LOSS:  Minimal.  COMPLICATIONS:  None known.  REASON FOR PROCEDURE:  Briefly, the patient is a 30 year old, gravida 6, para 3-0-3-3, who has had 3 previous cesarean deliveries, who desires permanent sterility.  She had failed IUD, Depo, and NuvaRing.  The patient was counseled regarding risks, benefits of procedure including risk of bleeding, infection, injury to surrounding structures, as well as permanency of the procedure, risk of failure of 1 in 100, and increased risk of ectopic.  The patient understood these risks and agreed to proceed.  DESCRIPTION OF PROCEDURE:  The patient was taken to the OR where she was placed in dorsal lithotomy in [place] stirrups.  She was prepped and draped in the usual sterile fashion.  A red rubber catheter was used to drain her bladder.  A speculum was placed inside the vagina.  The cervix was visualized and grasped anteriorly with a single-tooth tenaculum. Hulka tenaculum was used into the uterine cavity.  However, it could not be passed easily, so an acorn tenaculum was used for uterine manipulation.  Attention was then turned to the abdomen.  A 6 mL of 0.25% Marcaine was injected at the umbilicus.  The skin of the  umbilicus was brought up with two Allis clamps.  A vertical incision was made through the umbilicus.  The fascia was then grasped with two Kochers and incised sharply.  A hemostat was then used to enter the peritoneal cavity.  The edges of the fascia were tied with 0 Vicryl suture on a UR6.  The Hasson trocar was placed inside the abdomen and pneumoperitoneum was created.  The scope was put into the abdominal cavity and the patient was placed in Trendelenburg.  The uterus was lifted up and either tube could be easily identified.  A blunt probe was then passed to the operative scope until the patient's right tube was slipped up and out of the pelvis and the fimbriated end was seen.  The right ovary was also visualized and was normal.  A Filshie clip was placed across this without difficulty.  Similarly, the left tube could not be easily visualized.  A long probe was used to lifted up and out of the pelvis.  However, the ovary was never flipped up and able to be visualized.  The Filshie clip applier was then used to apply Filshie clip on this tube as well.  A picture was taken at the end of the case to document proper placement of both Filshie clips.  The instruments were then removed from the abdomen as was the Hasson trocar.  The aforementioned 0 Vicryl sutures on the UR-6 were used to close the fascia and figure-of-eight.  A subcuticular stitch was used for closure of the umbilicus.  Tegaderm was placed.  All instrument and lap counts were correct x2.  The patient was awakened and taken to recovery room in stable condition.     Shelbie Proctor. Shawnie Pons, M.D.     TSP/MEDQ  D:  06/12/2010  T:  06/13/2010  Job:  [id number]  Electronically Signed by Tinnie Gens M.D. on 06/19/2010 10:01:56 AM

## 2010-07-03 ENCOUNTER — Encounter (INDEPENDENT_AMBULATORY_CARE_PROVIDER_SITE_OTHER): Payer: Medicaid Other | Admitting: Family Medicine

## 2010-07-03 DIAGNOSIS — Z09 Encounter for follow-up examination after completed treatment for conditions other than malignant neoplasm: Secondary | ICD-10-CM

## 2010-07-04 NOTE — Assessment & Plan Note (Signed)
NAME:  KEISHAWN, DARSEY NO.:  [id number]  MEDICAL RECORD NO.:  [id number]           PATIENT TYPE:  LOCATION:  [facility] at [facility]           FACILITY:  PHYSICIAN:  Tinnie Gens, MD        DATE OF BIRTH:  [birth year]  DATE OF SERVICE:  07/03/2010                                 CLINIC NOTE  CHIEF COMPLAINT:  Postop followup.  HISTORY OF PRESENT ILLNESS:  The patient is a 30 year old gravida 6, para 3-0-3-3 who had a C-section.  She underwent laparoscopic tubal ligation with Filshie clips on Jun 12, 2010.  She is without significant complaint today and the incision is healing well.  She feels well.  PHYSICAL EXAMINATION:  VITAL SIGNS:  As noted in the chart. GENERAL:  She is a well-developed and well-nourished female in no acute distress. ABDOMEN:  Soft, nontender, nondistended.  Incisions are well healed.  IMPRESSION:  Postop check from bilateral tubal ligation, doing well.  PLAN:  Followup Pap in 8 months.          ______________________________ Tinnie Gens, MD    TP/MEDQ  D:  07/03/2010  T:  07/04/2010  Job:  [id number]

## 2010-09-11 ENCOUNTER — Encounter: Payer: Self-pay | Admitting: Obstetrics & Gynecology

## 2010-09-11 ENCOUNTER — Ambulatory Visit (INDEPENDENT_AMBULATORY_CARE_PROVIDER_SITE_OTHER): Payer: Medicaid Other | Admitting: Obstetrics & Gynecology

## 2010-09-11 ENCOUNTER — Other Ambulatory Visit: Payer: Self-pay | Admitting: Obstetrics & Gynecology

## 2010-09-11 VITALS — BP 105/84 | HR 92 | Ht 65.0 in | Wt 202.0 lb

## 2010-09-11 DIAGNOSIS — N390 Urinary tract infection, site not specified: Secondary | ICD-10-CM

## 2010-09-11 DIAGNOSIS — N760 Acute vaginitis: Secondary | ICD-10-CM

## 2010-09-11 LAB — POCT URINALYSIS DIPSTICK
Bilirubin, UA: NEGATIVE
Ketones, UA: NEGATIVE
Leukocytes, UA: NEGATIVE
Nitrite, UA: NEGATIVE
Protein, UA: NEGATIVE

## 2010-09-11 NOTE — Progress Notes (Signed)
  Subjective:    Patient ID: [name], female    DOB: [birth year], 30 y.o.   MRN: [id number]  HPI Patient came in today reporting suprapubic pain, dysuria, itching vaginal discharge for the past few days.  No other systemic symptoms.  The following portions of the patient's history were reviewed and updated as appropriate: allergies, current medications, past family history, past medical history, past social history, past surgical history and problem list. Last pap was in 03/07/10 and was normal.  Review of Systems  All other systems reviewed and are negative.      Objective:   Physical Exam  Constitutional: No distress.  Abdominal: Soft. There is tenderness.  Genitourinary: Uterus normal. Vaginal discharge found.  White discharge.  Pain in suprapubic area.  No tenderness on bimanual exam. Wet prep obtained.     Assessment & Plan:  Follow up UA and urine culture Follow up wet prep Manage accordingly. Return for worsening symptoms or other GYN concerns.  Yamaira Spinner A 09/11/2010 3:17 PM

## 2010-09-11 NOTE — Patient Instructions (Signed)
General Instructions for Vaginal Infections Vaginitis is a term to describe many common vaginal infections. These infections may be due to an imbalance of normal germs (bacteria) that exist in the vagina. Many others are caused by sexually transmitted diseases (STD's). If any medication was prescribed to treat your specific infection, it is very important that you take the medication as directed. Your caregiver may want to examine and treat your sex partner. CAUSES The vagina normally contains organisms (bacteria and yeast) in a balance. Certain factors can disturb this balance and cause an infection, such as:  Sexual intercourse.  Nursing.   Pregnancy.   Menopause.   Hormone changes in the body.  Antibiotics.   Infection elsewhere in your body.   Birth control pills or patches.  Douches.   Spermicides.   Medical illnesses (diabetes).   SYMPTOMS Different types of vaginal infections cause symptoms such as:  Itching.  Pain or burning.   Bad odor.   Pain or bleeding with sexual intercourse.   Redness of the vulva.  Abnormal discharge (yellow, green, heavy white and thick).   Fever.   A sore on the vulva or vagina.   Urinary symptoms (painful or bloody urine).  Pelvic and/or abdominal pain.   Rectal bleeding, discharge or pain.   DIAGNOSIS  Your caregiver will base the diagnosis upon the symptoms that you report.   A complete history of your sex life may be taken   You may have a pelvic exam.   A sample of your vaginal fluid and/or discharge will be examined under the microscope.   Cultures will help complete the exact diagnosis.  TREATMENT Treatment depends on the cause of your vaginitis. Your treatment may include a medicine that kills germs (antibiotic). The antibiotic may be a shot, a pill, and/or vaginal suppository or cream. It is not uncommon for more than one type of infection to be present. If more than one infection is present, two or more  medications may be required. Reoccurrence of vaginal infections may be treated with vaginal suppositories or vaginal cream 2 times a week, or as directed. If your caregiver finds that an STD exists, treatment of your sexual partner(s) is important. This is especially important for those infected with chlamydia, gonorrhea, trichomoniasis, bacterial vaginosis, syphilis and HIV infections. Treating sexual partners will prevent you from being re-infected and help stop the spread of STD infection to others. Although it is best to see a specialist for STD/HIV testing and counseling, this is not always possible. Some states/provinces permit something called "expedited partner therapy." This kind of program permits you to deliver prescription(s) to a partner without the partner having to seek a formal medical exam.  HOME CARE INSTRUCTIONS  Take all prescribed medication.  If applicable, speak to your partner about recommended treatment.   Do not have sexual intercourse for one week, or as directed by your caregiver.   Practice safe sex.   Use condoms.   Have only one sex partner.   Make sure your sex partner does not have any other sex partners.   Avoid tight pants and panty hose.  Wear cotton underwear.   Do not douche.   Avoid tampons, especially scented ones.   Take warm sitz baths.   Avoid vaginal sprays and perfumed soaps or bath oils.   Apply medicated cream (steroid cream) for itching or irritation with the permission of your caregiver.   SEEK MEDICAL CARE IF:  You have any kind of abnormal vaginal discharge.  Your sex partner has a genital infection.   You have pain or bleeding with sexual intercourse.   You have itching, pain, irritation or bleeding of the vulva.  SEEK IMMEDIATE MEDICAL CARE IF:  You have an oral temperature above 101, not controlled by medicine.   You have belly (abdominal) pain.   Symptoms do not improve within 3 days or as directed.   You develop  painful or bloody urine.   You develop rectal pain, bleeding or discharge.  Document Released: 10/17/2004 Document Re-Released: 06/27/2009 [facility] Patient Information 2011 [place], [place].Place vaginitis patient instructions here.

## 2010-09-12 LAB — WET PREP, GENITAL: Trich, Wet Prep: NONE SEEN

## 2010-09-13 LAB — URINE CULTURE: Colony Count: NO GROWTH

## 2010-09-14 NOTE — Assessment & Plan Note (Signed)
NAME:  [name], [name] NO.:  [id number]  MEDICAL RECORD NO.:  [id number]           PATIENT TYPE:  LOCATION:  [facility] at [facility]           FACILITY:  PHYSICIAN:  Tinnie Gens, MD        DATE OF BIRTH:  Feb 06, 1980  DATE OF SERVICE:  04/24/2010                                 CLINIC NOTE  CHIEF COMPLAINT:  Undesired fertility.  HISTORY OF PRESENT ILLNESS:  The patient is a 30 year old gravida 6, para 3-0-3-3 who had an IUD placed in October and had it removed in February.  At that time, we put in NuvaRing and she has decided that she would like to try a diaphragm today.  PHYSICAL EXAMINATION:  VITAL SIGNS:  As noted in the chart. GENERAL:  She is a well-developed and well-nourished female in no acute distress. GU:  Normal external female genitalia.  BUS normal.  Vagina is pink and rugated.  A #70 was initially tried and identified that it was a good fit, so we went with the #75 diaphragm and that was an excellent fit for her.  The prescription is written for this.  IMPRESSION:  Undesired fertility.  PLAN:  Fit for a diaphragm today; however, after leaving the room the patient changed her mind and decided that she wanted a BTL, so she is going to sign 30-day papers today, and we will schedule this for her at her earliest convenience.          ______________________________ Tinnie Gens, MD    TP/MEDQ  D:  04/24/2010  T:  04/25/2010  Job:  [id number]

## 2010-09-20 ENCOUNTER — Telehealth: Payer: Self-pay | Admitting: *Deleted

## 2010-09-20 DIAGNOSIS — B9689 Other specified bacterial agents as the cause of diseases classified elsewhere: Secondary | ICD-10-CM

## 2010-09-20 MED ORDER — METRONIDAZOLE 500 MG PO TABS: ORAL_TABLET | ORAL | Status: DC

## 2010-09-20 NOTE — Telephone Encounter (Signed)
Patient has a foul odor and discharge, she has a history of bacterial vaginosis and wishes to have meds called in for this.  It has gradually been getting worse for two weeks.

## 2010-10-15 LAB — CBC
HCT: 28.2 — ABNORMAL LOW
MCHC: 34.6
MCHC: 35.2
MCV: 95.2
MCV: 95.8
Platelets: 140 — ABNORMAL LOW
Platelets: 191
RBC: 3.75 — ABNORMAL LOW
RDW: 12.3
RDW: 12.4
WBC: 11.8 — ABNORMAL HIGH

## 2010-10-19 LAB — CBC
HCT: 34.2 — ABNORMAL LOW
Platelets: 206
RDW: 13.8
WBC: 8.4

## 2010-10-22 LAB — CBC
HCT: 33.4 — ABNORMAL LOW
Hemoglobin: 11.3 — ABNORMAL LOW
RBC: 3.57 — ABNORMAL LOW
RDW: 13.8
WBC: 6.7

## 2011-01-24 IMAGING — CR CERVICAL SPINE - COMPLETE 4+ VIEW
1 series · 7 of 7 positions shown · non-contrast
Comparison: none

REASON FOR EXAM: neck pain
COMMENTS:

[Series 1: view not recorded · 0.17mm/px · 7 of 7 slices shown]
[im 1/7]
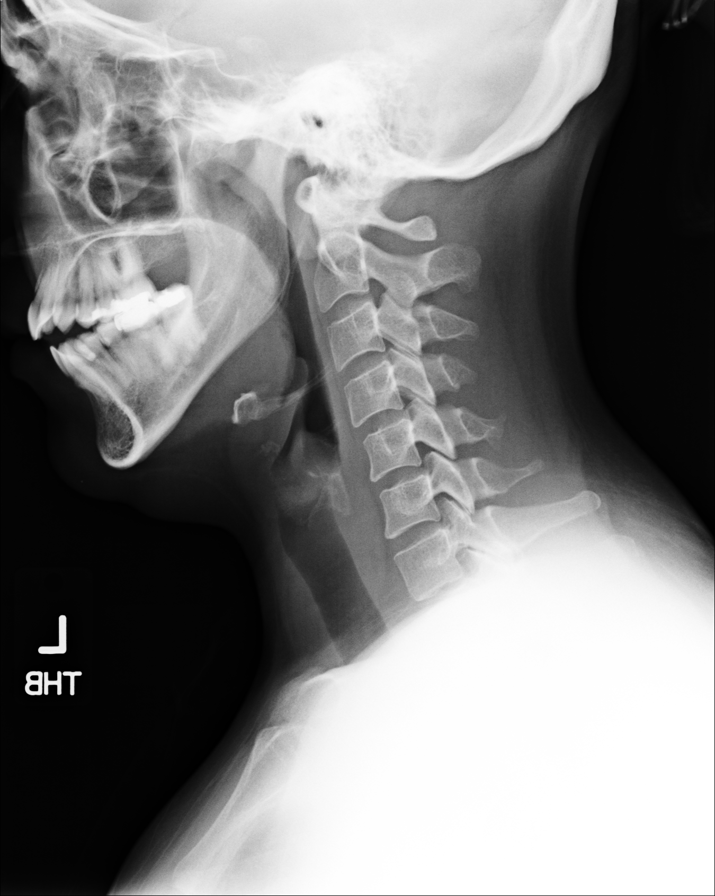
[im 2/7]
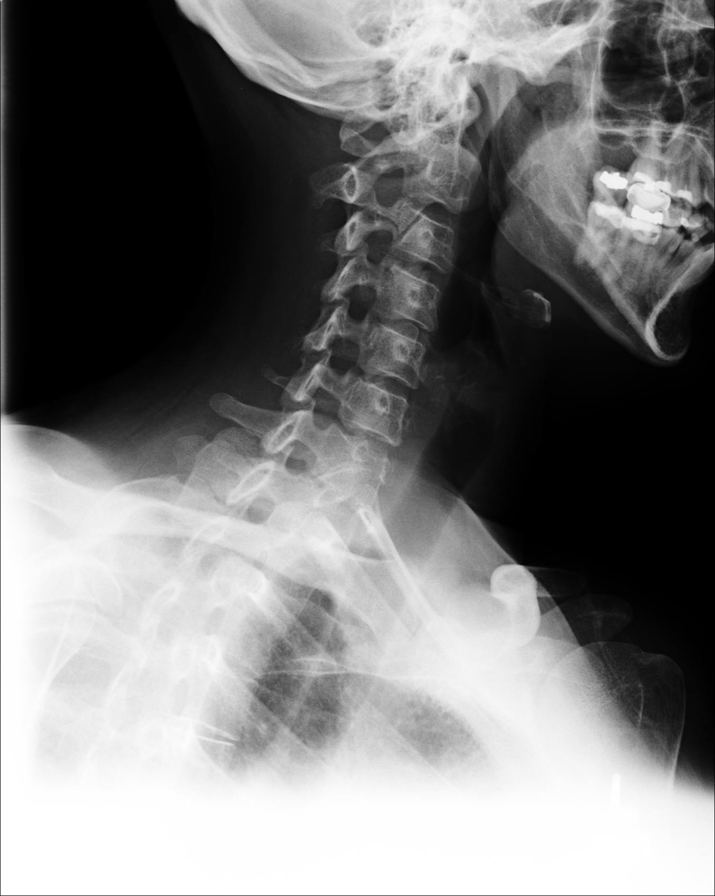
[im 3/7]
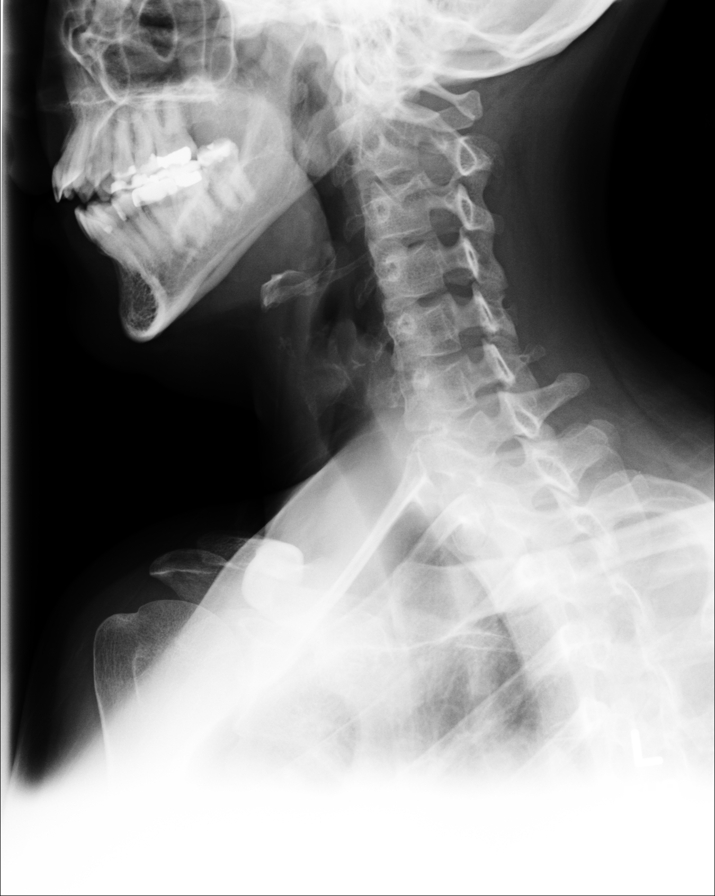
[im 4/7]
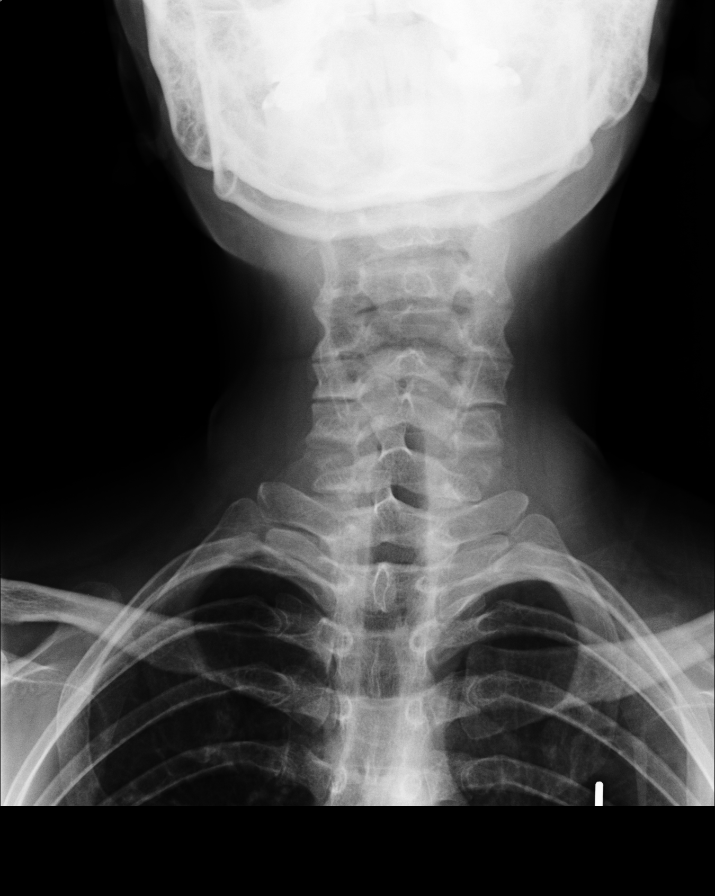
[im 5/7]
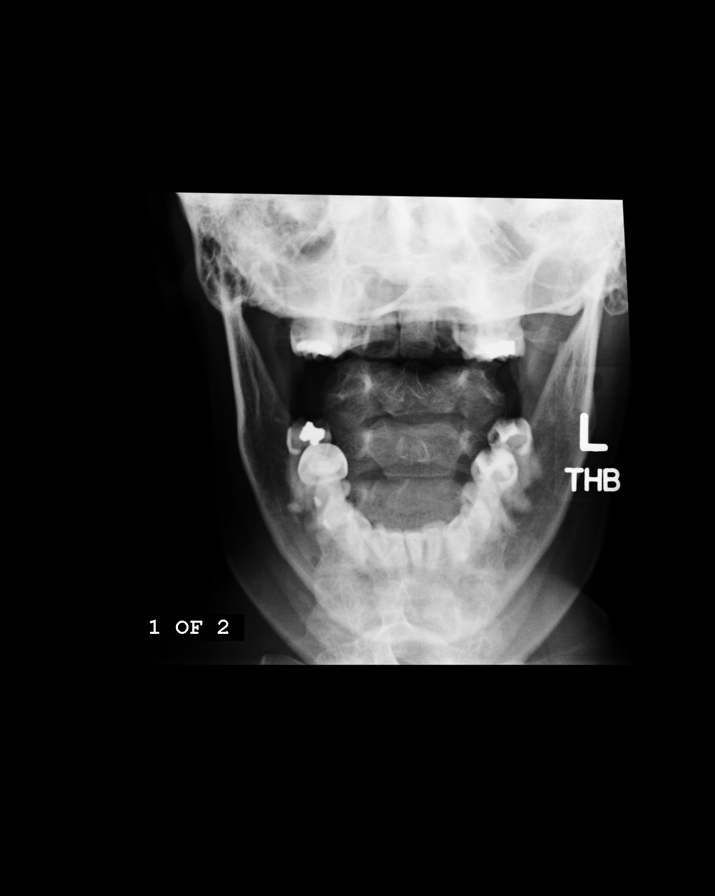
[im 6/7]
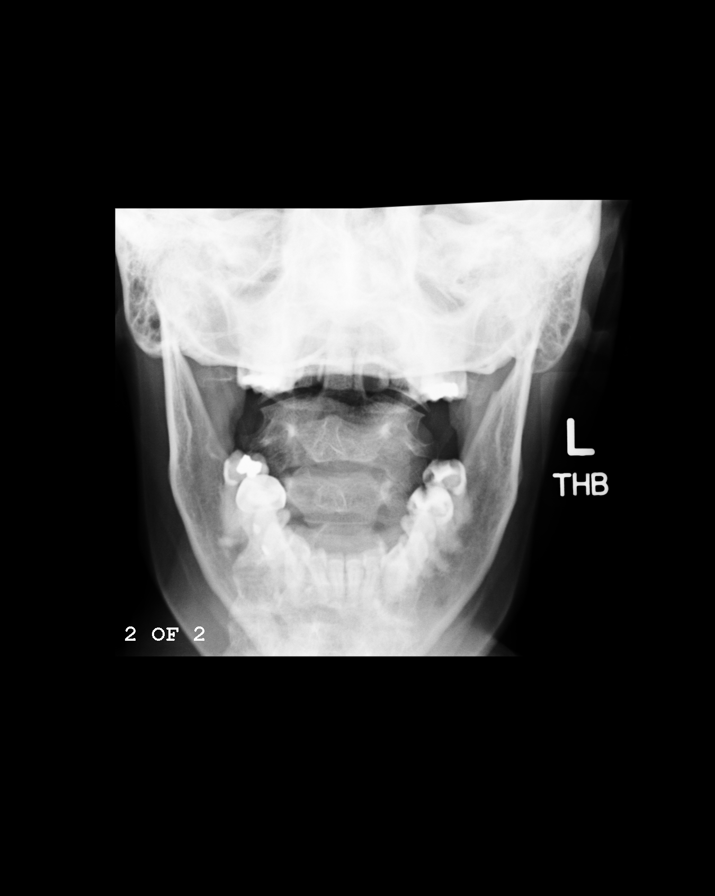
[im 7/7]
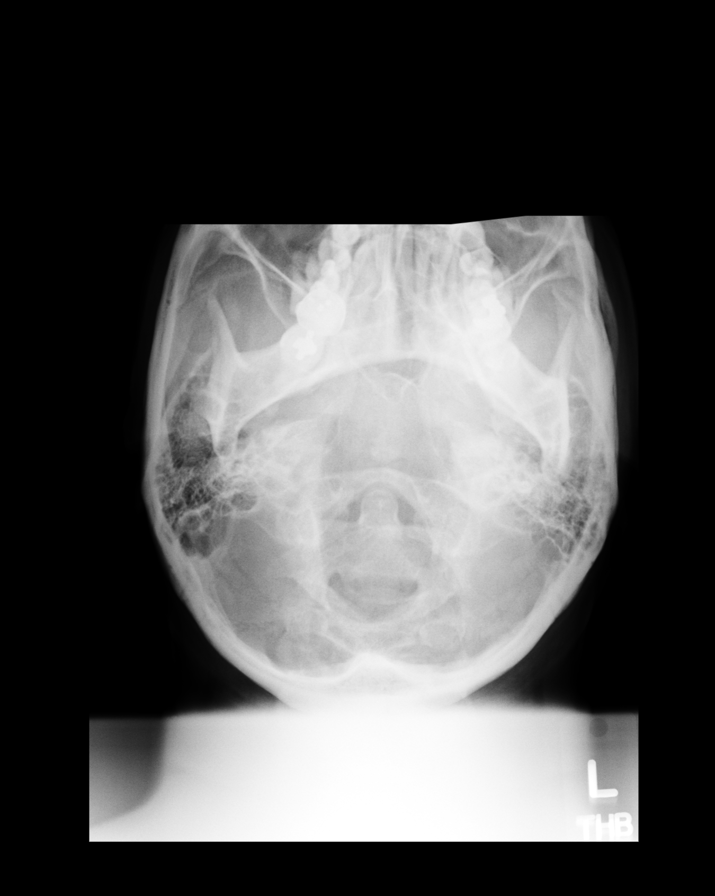

[7 of 7 positions shown; findings below may reference images not displayed]

PROCEDURE:     DXR - DXR CERVICAL SPINE COMPLETE  - March 31, 2008 [DATE]

RESULT:     The cervical vertebral bodies are preserved in height. The
prevertebral soft tissue spaces appear normal. The oblique views reveal no
bony encroachment upon the neural foramina. The odontoid appears intact. The
lateral masses of C1 align normally with those of C2.
IMPRESSION: I do not see evidence of acute cervical spine abnormality
nor significant degenerative change. Given the patient's symptoms, followup
MRI may be useful.

## 2011-03-06 ENCOUNTER — Telehealth: Payer: Self-pay | Admitting: *Deleted

## 2011-03-06 MED ORDER — DICLOFENAC POTASSIUM 50 MG PO TABS: 50.0000 mg | ORAL_TABLET | Freq: Three times a day (TID) | ORAL | Status: AC

## 2011-03-06 NOTE — Telephone Encounter (Signed)
Patient is requesting something stronger for her cramps.  I have recommended Cataflam for her to try.

## 2011-03-25 ENCOUNTER — Telehealth: Payer: Self-pay | Admitting: *Deleted

## 2011-03-25 ENCOUNTER — Ambulatory Visit: Payer: Medicaid Other | Admitting: Obstetrics & Gynecology

## 2011-03-25 DIAGNOSIS — N760 Acute vaginitis: Secondary | ICD-10-CM

## 2011-03-25 MED ORDER — METRONIDAZOLE 500 MG PO TABS: ORAL_TABLET | ORAL | Status: DC

## 2011-03-25 NOTE — Telephone Encounter (Signed)
Patient needs to reschedule her appointment due to work but would like a refill of her metronidazole due to recurrent BV .

## 2011-04-03 ENCOUNTER — Encounter: Payer: Self-pay | Admitting: Obstetrics and Gynecology

## 2011-04-03 ENCOUNTER — Ambulatory Visit (INDEPENDENT_AMBULATORY_CARE_PROVIDER_SITE_OTHER): Payer: Medicaid Other | Admitting: Obstetrics and Gynecology

## 2011-04-03 ENCOUNTER — Other Ambulatory Visit (HOSPITAL_COMMUNITY)
Admission: RE | Admit: 2011-04-03 | Discharge: 2011-04-03 | Disposition: A | Discharge status: Home / Self Care | Payer: Medicaid Other | Source: Ambulatory Visit | Attending: Obstetrics and Gynecology | Admitting: Obstetrics and Gynecology

## 2011-04-03 VITALS — BP 99/74 | HR 88 | Ht 65.0 in | Wt 213.0 lb

## 2011-04-03 DIAGNOSIS — Z01419 Encounter for gynecological examination (general) (routine) without abnormal findings: Secondary | ICD-10-CM | POA: Insufficient documentation

## 2011-04-03 NOTE — Patient Instructions (Signed)
Bacterial Vaginosis Bacterial vaginosis (BV) is a vaginal infection where the normal balance of bacteria in the vagina is disrupted. The normal balance is then replaced by an overgrowth of certain bacteria. There are several different kinds of bacteria that can cause BV. BV is the most common vaginal infection in women of childbearing age. CAUSES   The cause of BV is not fully understood. BV develops when there is an increase or imbalance of harmful bacteria.   Some activities or behaviors can upset the normal balance of bacteria in the vagina and put women at increased risk including:   Having a new sex partner or multiple sex partners.   Douching.   Using an intrauterine device (IUD) for contraception.   It is not clear what role sexual activity plays in the development of BV. However, women that have never had sexual intercourse are rarely infected with BV.  Women do not get BV from toilet seats, bedding, swimming pools or from touching objects around them.  SYMPTOMS   Grey vaginal discharge.   A fish-like odor with discharge, especially after sexual intercourse.   Itching or burning of the vagina and vulva.   Burning or pain with urination.   Some women have no signs or symptoms at all.  DIAGNOSIS  Your caregiver must examine the vagina for signs of BV. Your caregiver will perform lab tests and look at the sample of vaginal fluid through a microscope. They will look for bacteria and abnormal cells (clue cells), a pH test higher than 4.5, and a positive amine test all associated with BV.  RISKS AND COMPLICATIONS   Pelvic inflammatory disease (PID).   Infections following gynecology surgery.   Developing HIV.   Developing herpes virus.  TREATMENT  Sometimes BV will clear up without treatment. However, all women with symptoms of BV should be treated to avoid complications, especially if gynecology surgery is planned. Female partners generally do not need to be treated. However,  BV may spread between female sex partners so treatment is helpful in preventing a recurrence of BV.   BV may be treated with antibiotics. The antibiotics come in either pill or vaginal cream forms. Either can be used with nonpregnant or pregnant women, but the recommended dosages differ. These antibiotics are not harmful to the baby.   BV can recur after treatment. If this happens, a second round of antibiotics will often be prescribed.   Treatment is important for pregnant women. If not treated, BV can cause a premature delivery, especially for a pregnant woman who had a premature birth in the past. All pregnant women who have symptoms of BV should be checked and treated.   For chronic reoccurrence of BV, treatment with a type of prescribed gel vaginally twice a week is helpful.  HOME CARE INSTRUCTIONS   Finish all medication as directed by your caregiver.   Do not have sex until treatment is completed.   Tell your sexual partner that you have a vaginal infection. They should see their caregiver and be treated if they have problems, such as a mild rash or itching.   Practice safe sex. Use condoms. Only have 1 sex partner.  PREVENTION  Basic prevention steps can help reduce the risk of upsetting the natural balance of bacteria in the vagina and developing BV:  Do not have sexual intercourse (be abstinent).   Do not douche.   Use all of the medicine prescribed for treatment of BV, even if the signs and symptoms go away.     Tell your sex partner if you have BV. That way, they can be treated, if needed, to prevent reoccurrence.  SEEK MEDICAL CARE IF:   Your symptoms are not improving after 3 days of treatment.   You have increased discharge, pain, or fever.  MAKE SURE YOU:   Understand these instructions.   Will watch your condition.   Will get help right away if you are not doing well or get worse.  FOR MORE INFORMATION  Division of STD Prevention (DSTDP), Centers for  Disease Control and Prevention: SolutionApps.[employer] American Social Health Association (ASHA): www.ashastd.org  Document Released: 01/07/2005 Document Revised: 12/27/2010 Document Reviewed: 06/30/2008 [facility] Patient Information 2012 [place], [place].   Preventive Care for Adults, Female A healthy lifestyle and preventive care can promote health and wellness. Preventive health guidelines for women include the following key practices.  A routine yearly physical is a good way to check with your caregiver about your health and preventive screening. It is a chance to share any concerns and updates on your health, and to receive a thorough exam.   Visit your dentist for a routine exam and preventive care every 6 months. Brush your teeth twice a day and floss once a day. Good oral hygiene prevents tooth decay and gum disease.   The frequency of eye exams is based on your age, health, family medical history, use of contact lenses, and other factors. Follow your caregiver's recommendations for frequency of eye exams.   Eat a healthy diet. Foods like vegetables, fruits, whole grains, low-fat dairy products, and lean protein foods contain the nutrients you need without too many calories. Decrease your intake of foods high in solid fats, added sugars, and salt. Eat the right amount of calories for you.Get information about a proper diet from your caregiver, if necessary.   Regular physical exercise is one of the most important things you can do for your health. Most adults should get at least 150 minutes of moderate-intensity exercise (any activity that increases your heart rate and causes you to sweat) each week. In addition, most adults need muscle-strengthening exercises on 2 or more days a week.   Maintain a healthy weight. The body mass index (BMI) is a screening tool to identify possible weight problems. It provides an estimate of body fat based on height and weight. Your caregiver can help determine your  BMI, and can help you achieve or maintain a healthy weight.For adults 20 years and older:   A BMI below 18.5 is considered underweight.   A BMI of 18.5 to 24.9 is normal.   A BMI of 25 to 29.9 is considered overweight.   A BMI of 30 and above is considered obese.   Maintain normal blood lipids and cholesterol levels by exercising and minimizing your intake of saturated fat. Eat a balanced diet with plenty of fruit and vegetables. Blood tests for lipids and cholesterol should begin at age 24 and be repeated every 5 years. If your lipid or cholesterol levels are high, you are over 50, or you are at high risk for heart disease, you may need your cholesterol levels checked more frequently.Ongoing high lipid and cholesterol levels should be treated with medicines if diet and exercise are not effective.   If you smoke, find out from your caregiver how to quit. If you do not use tobacco, do not start.   If you are pregnant, do not drink alcohol. If you are breastfeeding, be very cautious about drinking alcohol. If you are not  pregnant and choose to drink alcohol, do not exceed 1 drink per day. One drink is considered to be 12 ounces (355 mL) of beer, 5 ounces (148 mL) of wine, or 1.5 ounces (44 mL) of liquor.   Avoid use of street drugs. Do not share needles with anyone. Ask for help if you need support or instructions about stopping the use of drugs.   High blood pressure causes heart disease and increases the risk of stroke. Your blood pressure should be checked at least every 1 to 2 years. Ongoing high blood pressure should be treated with medicines if weight loss and exercise are not effective.   If you are 27 to 32 years old, ask your caregiver if you should take aspirin to prevent strokes.   Diabetes screening involves taking a blood sample to check your fasting blood sugar level. This should be done once every 3 years, after age 53, if you are within normal weight and without risk factors for  diabetes. Testing should be considered at a younger age or be carried out more frequently if you are overweight and have at least 1 risk factor for diabetes.   Breast cancer screening is essential preventive care for women. You should practice "breast self-awareness." This means understanding the normal appearance and feel of your breasts and may include breast self-examination. Any changes detected, no matter how small, should be reported to a caregiver. Women in their 6s and 30s should have a clinical breast exam (CBE) by a caregiver as part of a regular health exam every 1 to 3 years. After age 71, women should have a CBE every year. Starting at age 31, women should consider having a mammography (breast X-ray test) every year. Women who have a family history of breast cancer should talk to their caregiver about genetic screening. Women at a high risk of breast cancer should talk to their caregivers about having magnetic resonance imaging (MRI) and a mammography every year.   The Pap test is a screening test for cervical cancer. A Pap test can show cell changes on the cervix that might become cervical cancer if left untreated. A Pap test is a procedure in which cells are obtained and examined from the lower end of the uterus (cervix).   Women should have a Pap test starting at age 88.   Between ages 57 and 22, Pap tests should be repeated every 2 years.   Beginning at age 34, you should have a Pap test every 3 years as long as the past 3 Pap tests have been normal.   Some women have medical problems that increase the chance of getting cervical cancer. Talk to your caregiver about these problems. It is especially important to talk to your caregiver if a new problem develops soon after your last Pap test. In these cases, your caregiver may recommend more frequent screening and Pap tests.   The above recommendations are the same for women who have or have not gotten the vaccine for human papillomavirus  (HPV).   If you had a hysterectomy for a problem that was not cancer or a condition that could lead to cancer, then you no longer need Pap tests. Even if you no longer need a Pap test, a regular exam is a good idea to make sure no other problems are starting.   If you are between ages 16 and 87, and you have had normal Pap tests going back 10 years, you no longer need Pap tests. Even  if you no longer need a Pap test, a regular exam is a good idea to make sure no other problems are starting.   If you have had past treatment for cervical cancer or a condition that could lead to cancer, you need Pap tests and screening for cancer for at least 20 years after your treatment.   If Pap tests have been discontinued, risk factors (such as a new sexual partner) need to be reassessed to determine if screening should be resumed.   The HPV test is an additional test that may be used for cervical cancer screening. The HPV test looks for the virus that can cause the cell changes on the cervix. The cells collected during the Pap test can be tested for HPV. The HPV test could be used to screen women aged 61 years and older, and should be used in women of any age who have unclear Pap test results. After the age of 85, women should have HPV testing at the same frequency as a Pap test.   Colorectal cancer can be detected and often prevented. Most routine colorectal cancer screening begins at the age of 70 and continues through age 56. However, your caregiver may recommend screening at an earlier age if you have risk factors for colon cancer. On a yearly basis, your caregiver may provide home test kits to check for hidden blood in the stool. Use of a small camera at the end of a tube, to directly examine the colon (sigmoidoscopy or colonoscopy), can detect the earliest forms of colorectal cancer. Talk to your caregiver about this at age 32, when routine screening begins. Direct examination of the colon should be repeated  every 5 to 10 years through age 34, unless early forms of pre-cancerous polyps or small growths are found.   Hepatitis C blood testing is recommended for all people born from [birth year] through 1966 and any individual with known risks for hepatitis C.   Practice safe sex. Use condoms and avoid high-risk sexual practices to reduce the spread of sexually transmitted infections (STIs). STIs include gonorrhea, chlamydia, syphilis, trichomonas, herpes, HPV, and human immunodeficiency virus (HIV). Herpes, HIV, and HPV are viral illnesses that have no cure. They can result in disability, cancer, and death. Sexually active women aged 25 and younger should be checked for chlamydia. Older women with new or multiple partners should also be tested for chlamydia. Testing for other STIs is recommended if you are sexually active and at increased risk.   Osteoporosis is a disease in which the bones lose minerals and strength with aging. This can result in serious bone fractures. The risk of osteoporosis can be identified using a bone density scan. Women ages 22 and over and women at risk for fractures or osteoporosis should discuss screening with their caregivers. Ask your caregiver whether you should take a calcium supplement or vitamin D to reduce the rate of osteoporosis.   Menopause can be associated with physical symptoms and risks. Hormone replacement therapy is available to decrease symptoms and risks. You should talk to your caregiver about whether hormone replacement therapy is right for you.   Use sunscreen with sun protection factor (SPF) of 30 or more. Apply sunscreen liberally and repeatedly throughout the day. You should seek shade when your shadow is shorter than you. Protect yourself by wearing long sleeves, pants, a wide-brimmed hat, and sunglasses year round, whenever you are outdoors.   Once a month, do a whole body skin exam, using a mirror to  look at the skin on your back. Notify your caregiver of new  moles, moles that have irregular borders, moles that are larger than a pencil eraser, or moles that have changed in shape or color.   Stay current with required immunizations.   Influenza. You need a dose every fall (or winter). The composition of the flu vaccine changes each year, so being vaccinated once is not enough.   Pneumococcal polysaccharide. You need 1 to 2 doses if you smoke cigarettes or if you have certain chronic medical conditions. You need 1 dose at age 18 (or older) if you have never been vaccinated.   Tetanus, diphtheria, pertussis (Tdap, Td). Get 1 dose of Tdap vaccine if you are younger than age 29, are over 41 and have contact with an infant, are a Research scientist (physical sciences), are pregnant, or simply want to be protected from whooping cough. After that, you need a Td booster dose every 10 years. Consult your caregiver if you have not had at least 3 tetanus and diphtheria-containing shots sometime in your life or have a deep or dirty wound.   HPV. You need this vaccine if you are a woman age 1 or younger. The vaccine is given in 3 doses over 6 months.   Measles, mumps, rubella (MMR). You need at least 1 dose of MMR if you were born in 1957 or later. You may also need a second dose.   Meningococcal. If you are age 57 to 36 and a first-year college student living in a residence hall, or have one of several medical conditions, you need to get vaccinated against meningococcal disease. You may also need additional booster doses.   Zoster (shingles). If you are age 58 or older, you should get this vaccine.   Varicella (chickenpox). If you have never had chickenpox or you were vaccinated but received only 1 dose, talk to your caregiver to find out if you need this vaccine.   Hepatitis A. You need this vaccine if you have a specific risk factor for hepatitis A virus infection or you simply wish to be protected from this disease. The vaccine is usually given as 2 doses, 6 to 18 months apart.     Hepatitis B. You need this vaccine if you have a specific risk factor for hepatitis B virus infection or you simply wish to be protected from this disease. The vaccine is given in 3 doses, usually over 6 months.  Preventive Services / Frequency Ages 19 to 48  Blood pressure check.** / Every 1 to 2 years.   Lipid and cholesterol check.** / Every 5 years beginning at age 71.   Clinical breast exam.** / Every 3 years for women in their 53s and 30s.   Pap test.** / Every 2 years from ages 37 through 37. Every 3 years starting at age 21 through age 13 or 26 with a history of 3 consecutive normal Pap tests.   HPV screening.** / Every 3 years from ages 50 through ages 55 to 57 with a history of 3 consecutive normal Pap tests.   Hepatitis C blood test.** / For any individual with known risks for hepatitis C.   Skin self-exam. / Monthly.   Influenza immunization.** / Every year.   Pneumococcal polysaccharide immunization.** / 1 to 2 doses if you smoke cigarettes or if you have certain chronic medical conditions.   Tetanus, diphtheria, pertussis (Tdap, Td) immunization. / A one-time dose of Tdap vaccine. After that, you need a Td booster dose  every 10 years.   HPV immunization. / 3 doses over 6 months, if you are 28 and younger.   Measles, mumps, rubella (MMR) immunization. / You need at least 1 dose of MMR if you were born in 1957 or later. You may also need a second dose.   Meningococcal immunization. / 1 dose if you are age 64 to 24 and a first-year college student living in a residence hall, or have one of several medical conditions, you need to get vaccinated against meningococcal disease. You may also need additional booster doses.   Varicella immunization.** / Consult your caregiver.   Hepatitis A immunization.** / Consult your caregiver. 2 doses, 6 to 18 months apart.   Hepatitis B immunization.** / Consult your caregiver. 3 doses usually over 6 months.    ** Family history and  personal history of risk and conditions may change your caregiver's recommendations. Document Released: 03/05/2001 Document Revised: 12/27/2010 Document Reviewed: 06/04/2010 [facility] Patient Information 2012 [place], [place].

## 2011-04-03 NOTE — Progress Notes (Signed)
  Subjective:    Patient ID: [name], female    DOB: 05/25/1980, 31 y.o.   MRN: [id number]  HPI  31 yo G3P3 with LMP 03/29/2011 presenting today for annual exam. Patient is doing well and without complaints: denies abnormal bleeding, discharge or pelvic pain. Patient has had BTL in 05/2010. Patient reports being treated for BV over the past few months with Flagyl. She doesn't have any symptoms fro BV at this time and declined to be tested for it.   Past Medical History  Diagnosis Date  . Anxiety   . Arthritis   . Abnormal Pap smear    Past Surgical History  Procedure Date  . Dilation and evacuation   . Wisdom tooth extraction   . Tubal ligation   . Leep   . Tubal ligation 06/12/10  . Cesarean section      X 3  . Dilation and curettage of uterus 2004/2007/2009   Family History  Problem Relation Age of Onset  . Arthritis Mother   . Arthritis Maternal Grandmother    History   Social History  . Marital Status: Divorced    Spouse Name: N/A    Number of Children: N/A  . Years of Education: N/A   Occupational History  . Not on file.   Social History Main Topics  . Smoking status: Former Smoker    Types: Cigarettes    Quit date: 06/11/2010  . Smokeless tobacco: Not on file  . Alcohol Use: No  . Drug Use: No  . Sexually Active: Yes -- Female partner(s)     tubaligation   Other Topics Concern  . Not on file   Social History Narrative  . No narrative on file    Review of Systems  All other systems reviewed and are negative.       Objective:   Physical Exam GENERAL: Well-developed, well-nourished female in no acute distress.  HEENT: Normocephalic, atraumatic. Sclerae anicteric.  NECK: Supple. Normal thyroid.  LUNGS: Clear to auscultation bilaterally.  HEART: Regular rate and rhythm. BREASTS: Symmetric in size. No palpable masses or lymphadenopathy, skin changes, or nipple drainage. ABDOMEN: Soft, nontender, nondistended. No organomegaly. PELVIC: Normal  external female genitalia. Vagina is pink and rugated.  Normal discharge. Normal appearing cervix. Uterus is normal in size. No adnexal mass or tenderness. EXTREMITIES: No cyanosis, clubbing, or edema, 2+ distal pulses.     Assessment & Plan:  31 yo G3P3 here for annual exam - pap smear performed today - Patient encouraged to continue monthly self breast and vulva exam - Advised patient that if recurrent BV infection occurs, may need to treat with clindamycin instead

## 2011-07-05 IMAGING — CR DG LUMBAR SPINE AP/LAT/OBLIQUES W/ FLEX AND EXT
1 series · 5 of 5 positions shown · non-contrast
Comparison: none

REASON FOR EXAM: Lumbago
COMMENTS:

[Series 1: view not recorded · 0.17mm/px · 5 of 5 slices shown]
[im 1/5]
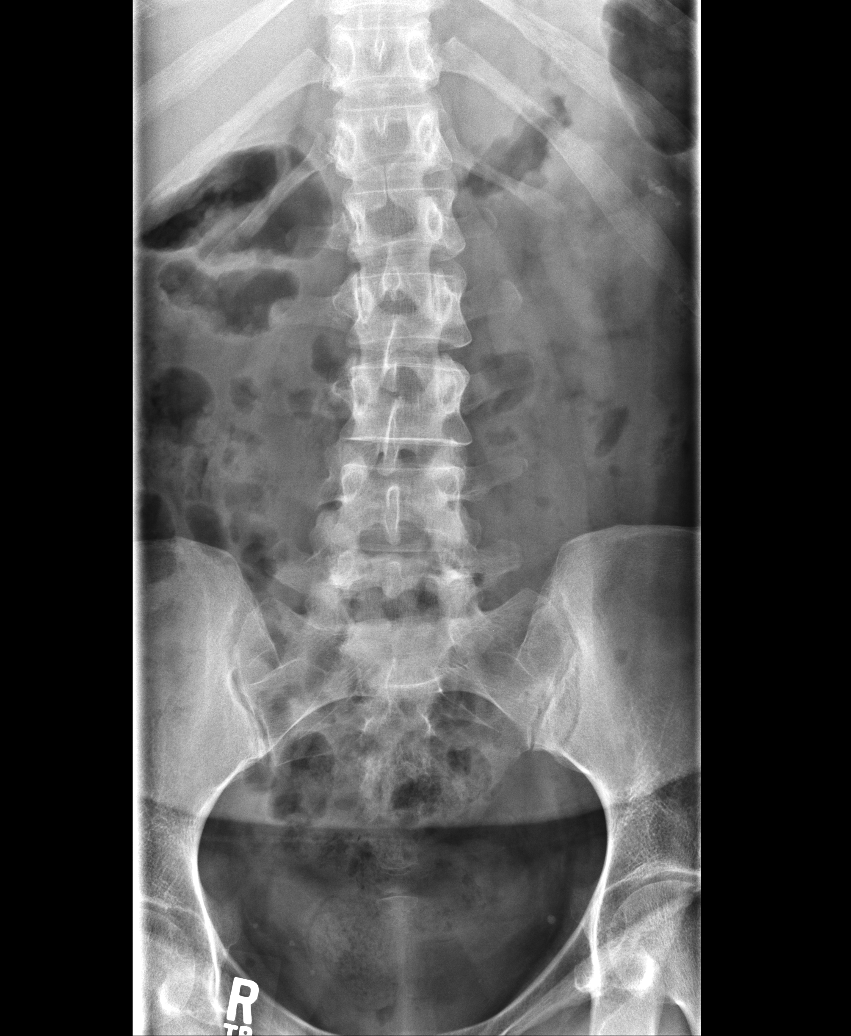
[im 2/5]
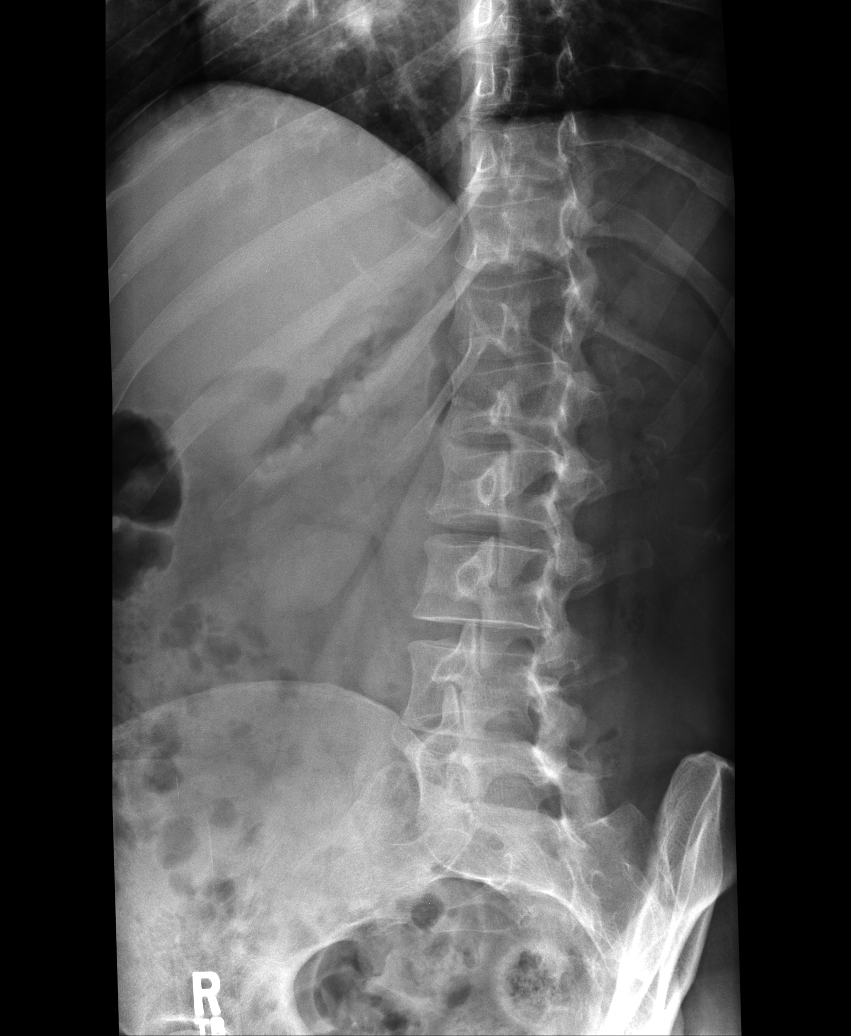
[im 3/5]
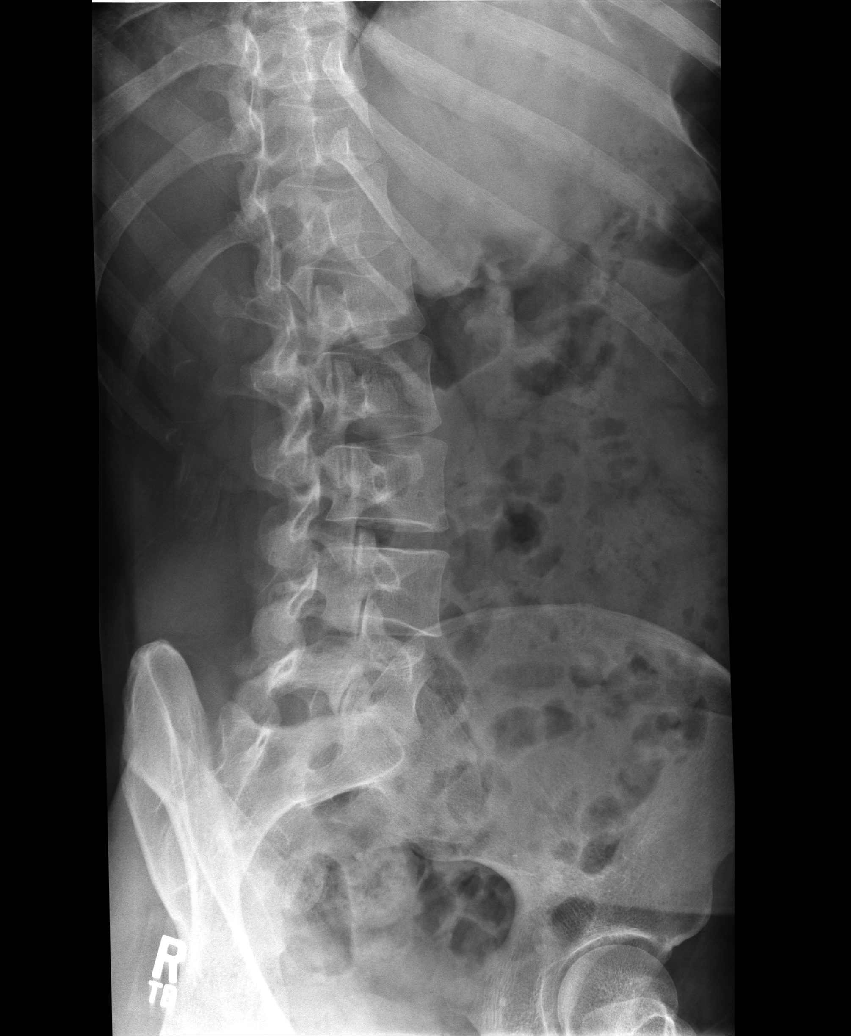
[im 4/5]
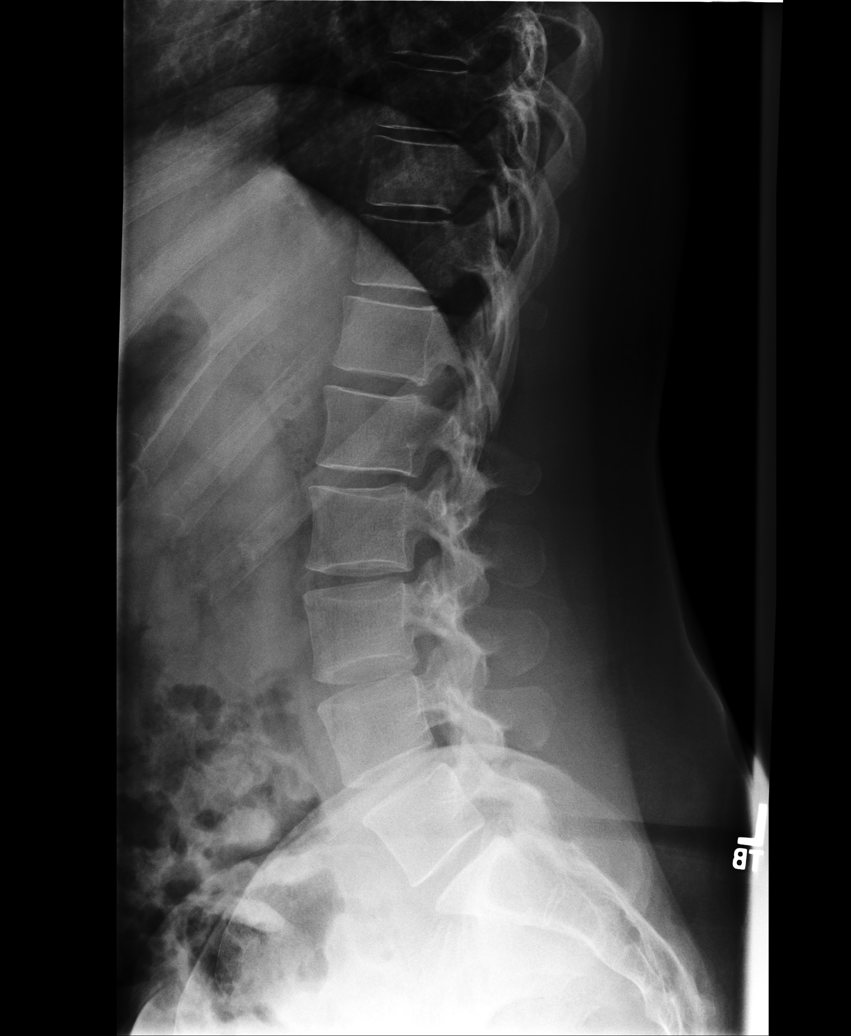
[im 5/5]
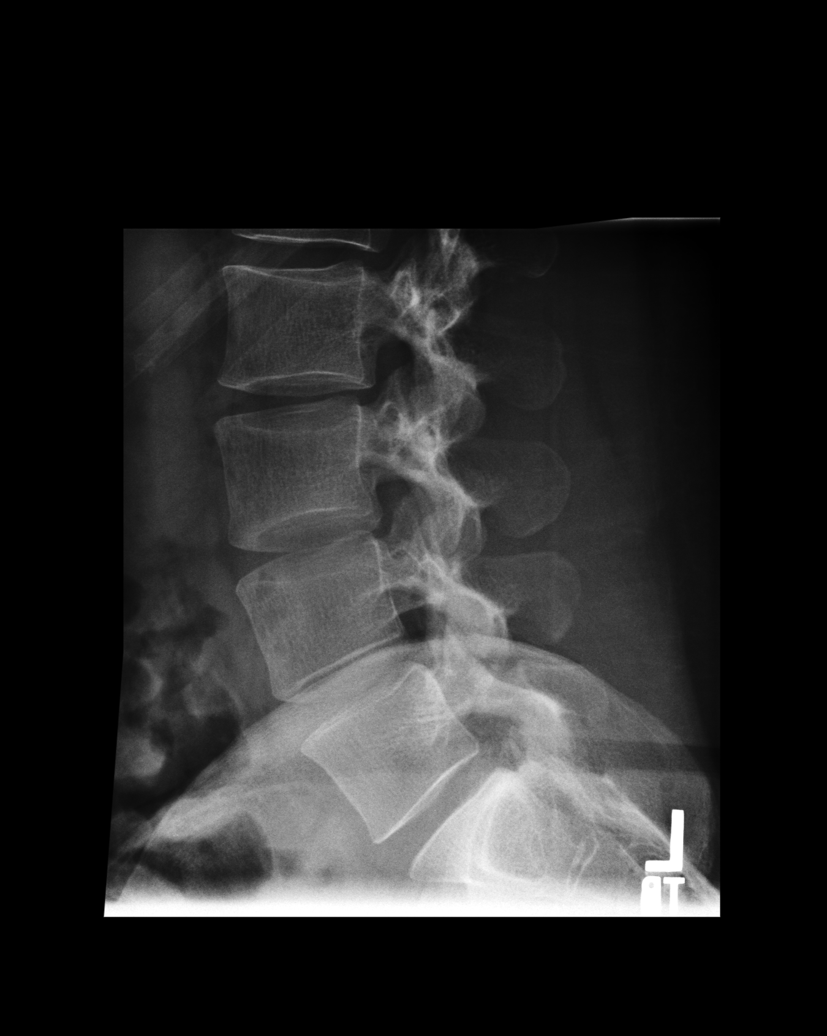

[5 of 5 positions shown; findings below may reference images not displayed]

PROCEDURE:     DXR - DXR LUMBAR SPINE WITH OBLIQUES  - September 09, 2008  [DATE]

RESULT:     The vertebral body heights and the intervertebral disc spaces
are well maintained. The vertebral body alignment is normal. Oblique views
show no significant abnormalities of the articular facets. The pedicles and
transverse processes are normal in appearance. In the AP view, there is
noted a very slight lumbar scoliosis with convexity to the left.
IMPRESSION: 1.  No fracture or other acute bony abnormality is seen.
2.  No lytic or blastic lesions are seen.
3.  There is a slight lumbar scoliosis with convexity to the left.

## 2011-09-24 ENCOUNTER — Emergency Department: Payer: Self-pay | Admitting: Emergency Medicine

## 2011-11-13 ENCOUNTER — Telehealth: Payer: Self-pay | Admitting: *Deleted

## 2011-11-13 DIAGNOSIS — N760 Acute vaginitis: Secondary | ICD-10-CM

## 2011-11-13 MED ORDER — METRONIDAZOLE 500 MG PO TABS: ORAL_TABLET | ORAL | Status: DC

## 2011-11-13 NOTE — Telephone Encounter (Signed)
Patient is having a recurrence of bv and would like meds called in.

## 2012-05-25 ENCOUNTER — Encounter: Payer: Self-pay | Admitting: *Deleted

## 2013-11-22 ENCOUNTER — Encounter: Payer: Self-pay | Admitting: Obstetrics and Gynecology

## 2016-01-01 ENCOUNTER — Emergency Department: Payer: No Typology Code available for payment source

## 2016-01-01 ENCOUNTER — Emergency Department
Admission: EM | Admit: 2016-01-01 | Discharge: 2016-01-01 | Disposition: A | Discharge status: Home / Self Care | Payer: No Typology Code available for payment source | Attending: Emergency Medicine | Admitting: Emergency Medicine

## 2016-01-01 DIAGNOSIS — M7918 Myalgia, other site: Secondary | ICD-10-CM

## 2016-01-01 DIAGNOSIS — Z79899 Other long term (current) drug therapy: Secondary | ICD-10-CM | POA: Insufficient documentation

## 2016-01-01 DIAGNOSIS — S0003XA Contusion of scalp, initial encounter: Secondary | ICD-10-CM | POA: Diagnosis not present

## 2016-01-01 DIAGNOSIS — S161XXA Strain of muscle, fascia and tendon at neck level, initial encounter: Secondary | ICD-10-CM

## 2016-01-01 DIAGNOSIS — Y9389 Activity, other specified: Secondary | ICD-10-CM | POA: Diagnosis not present

## 2016-01-01 DIAGNOSIS — S169XXA Unspecified injury of muscle, fascia and tendon at neck level, initial encounter: Secondary | ICD-10-CM | POA: Insufficient documentation

## 2016-01-01 DIAGNOSIS — Y92410 Unspecified street and highway as the place of occurrence of the external cause: Secondary | ICD-10-CM | POA: Diagnosis not present

## 2016-01-01 DIAGNOSIS — M25512 Pain in left shoulder: Secondary | ICD-10-CM | POA: Insufficient documentation

## 2016-01-01 DIAGNOSIS — R51 Headache: Secondary | ICD-10-CM | POA: Insufficient documentation

## 2016-01-01 DIAGNOSIS — Y999 Unspecified external cause status: Secondary | ICD-10-CM | POA: Diagnosis not present

## 2016-01-01 DIAGNOSIS — Z87891 Personal history of nicotine dependence: Secondary | ICD-10-CM | POA: Insufficient documentation

## 2016-01-01 DIAGNOSIS — M25552 Pain in left hip: Secondary | ICD-10-CM | POA: Insufficient documentation

## 2016-01-01 DIAGNOSIS — S199XXA Unspecified injury of neck, initial encounter: Secondary | ICD-10-CM | POA: Diagnosis present

## 2016-01-01 LAB — POCT PREGNANCY, URINE: PREG TEST UR: NEGATIVE

## 2016-01-01 MED ORDER — TRAMADOL HCL 50 MG PO TABS
50.0000 mg | ORAL_TABLET | Freq: Once | ORAL | Status: AC
  Administered 2016-01-01: 50 mg via ORAL
  Filled 2016-01-01: qty 1

## 2016-01-01 MED ORDER — OXYCODONE-ACETAMINOPHEN 5-325 MG PO TABS: 1.0000 | ORAL_TABLET | Freq: Four times a day (QID) | ORAL | 0 refills | Status: DC | PRN

## 2016-01-01 MED ORDER — METHOCARBAMOL 750 MG PO TABS: 750.0000 mg | ORAL_TABLET | Freq: Four times a day (QID) | ORAL | 0 refills | Status: DC
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
## 2016-01-08 ENCOUNTER — Emergency Department: Payer: Medicaid Other

## 2016-01-08 ENCOUNTER — Emergency Department
Admission: EM | Admit: 2016-01-08 | Discharge: 2016-01-08 | Disposition: A | Discharge status: Home / Self Care | Payer: Medicaid Other | Attending: Emergency Medicine | Admitting: Emergency Medicine

## 2016-01-08 ENCOUNTER — Encounter: Payer: Self-pay | Admitting: Emergency Medicine

## 2016-01-08 DIAGNOSIS — R1021 Pelvic and perineal pain right side: Secondary | ICD-10-CM

## 2016-01-08 DIAGNOSIS — R102 Pelvic and perineal pain: Secondary | ICD-10-CM | POA: Insufficient documentation

## 2016-01-08 DIAGNOSIS — R1031 Right lower quadrant pain: Secondary | ICD-10-CM | POA: Diagnosis present

## 2016-01-08 DIAGNOSIS — Z87891 Personal history of nicotine dependence: Secondary | ICD-10-CM | POA: Diagnosis not present

## 2016-01-08 DIAGNOSIS — N83201 Unspecified ovarian cyst, right side: Secondary | ICD-10-CM

## 2016-01-08 LAB — COMPREHENSIVE METABOLIC PANEL
ALBUMIN: 4 g/dL (ref 3.5–5.0)
ALK PHOS: 82 U/L (ref 38–126)
ALT: 15 U/L (ref 14–54)
AST: 14 U/L — AB (ref 15–41)
Anion gap: 3 — ABNORMAL LOW (ref 5–15)
BUN: 12 mg/dL (ref 6–20)
CALCIUM: 8.8 mg/dL — AB (ref 8.9–10.3)
CHLORIDE: 106 mmol/L (ref 101–111)
CO2: 28 mmol/L (ref 22–32)
CREATININE: 0.63 mg/dL (ref 0.44–1.00)
GFR calc Af Amer: >60 mL/min (ref >60)
GFR calc non Af Amer: >60 mL/min (ref >60)
GLUCOSE: 93 mg/dL (ref 65–99)
Potassium: 3.9 mmol/L (ref 3.5–5.1)
SODIUM: 137 mmol/L (ref 135–145)
Total Bilirubin: 0.3 mg/dL (ref 0.3–1.2)
Total Protein: 7.2 g/dL (ref 6.5–8.1)

## 2016-01-08 LAB — LIPASE, BLOOD: LIPASE: 15 U/L (ref 11–51)

## 2016-01-08 LAB — CBC
HCT: 37.4 % (ref 35.0–47.0)
Hemoglobin: 12.5 g/dL (ref 12.0–16.0)
MCH: 29.6 pg (ref 26.0–34.0)
MCHC: 33.6 g/dL (ref 32.0–36.0)
MCV: 88 fL (ref 80.0–100.0)
PLATELETS: 245 10*3/uL (ref 150–440)
RBC: 4.24 MIL/uL (ref 3.80–5.20)
RDW: 12.7 % (ref 11.5–14.5)
WBC: 7.3 10*3/uL (ref 3.6–11.0)

## 2016-01-08 LAB — URINALYSIS, COMPLETE (UACMP) WITH MICROSCOPIC
Bacteria, UA: NONE SEEN
Bilirubin Urine: NEGATIVE
Glucose, UA: NEGATIVE mg/dL
Hgb urine dipstick: NEGATIVE
KETONES UR: NEGATIVE mg/dL
Leukocytes, UA: NEGATIVE
Nitrite: NEGATIVE
PH: 7 (ref 5.0–8.0)
Protein, ur: NEGATIVE mg/dL
Specific Gravity, Urine: 1.011 (ref 1.005–1.030)

## 2016-01-08 LAB — WET PREP, GENITAL
CLUE CELLS WET PREP: NONE SEEN
Sperm: NONE SEEN
Trich, Wet Prep: NONE SEEN
YEAST WET PREP: NONE SEEN

## 2016-01-08 LAB — POCT PREGNANCY, URINE: PREG TEST UR: NEGATIVE

## 2016-01-08 LAB — CHLAMYDIA/NGC RT PCR (ARMC ONLY)
CHLAMYDIA TR: NOT DETECTED
N GONORRHOEAE: NOT DETECTED

## 2016-01-08 MED ORDER — KETOROLAC TROMETHAMINE 30 MG/ML IJ SOLN
30.0000 mg | Freq: Once | INTRAMUSCULAR | Status: AC
  Administered 2016-01-08: 30 mg via INTRAVENOUS
  Filled 2016-01-08: qty 1

## 2016-01-08 MED ORDER — SODIUM CHLORIDE 0.9 % IV BOLUS (SEPSIS)
1000.0000 mL | Freq: Once | INTRAVENOUS | Status: AC
  Administered 2016-01-08: 1000 mL via INTRAVENOUS

## 2016-01-08 MED ORDER — MORPHINE SULFATE (PF) 4 MG/ML IV SOLN
4.0000 mg | Freq: Once | INTRAVENOUS | Status: AC
  Administered 2016-01-08: 4 mg via INTRAVENOUS
  Filled 2016-01-08: qty 1

## 2016-01-08 MED ORDER — IOPAMIDOL (ISOVUE-300) INJECTION 61%
30.0000 mL | Freq: Once | INTRAVENOUS | Status: AC
Start: 2016-01-08 — End: 2016-01-08
  Administered 2016-01-08: 30 mL via ORAL

## 2016-01-08 MED ORDER — HYDROCODONE-ACETAMINOPHEN 5-325 MG PO TABS: 1.0000 | ORAL_TABLET | Freq: Four times a day (QID) | ORAL | 0 refills | Status: DC | PRN

## 2016-01-08 MED ORDER — IOPAMIDOL (ISOVUE-300) INJECTION 61%
100.0000 mL | Freq: Once | INTRAVENOUS | Status: AC | PRN
  Administered 2016-01-08: 100 mL via INTRAVENOUS

## 2016-01-08 NOTE — ED Triage Notes (Signed)
R lower abd pain since yesterday.

## 2016-01-08 NOTE — ED Provider Notes (Signed)
[facility]-EMERGENCY DEPARTMENT Provider Note   CSN: [id number] Arrival date & time: 01/08/16  0808     History   Chief Complaint Chief Complaint  Patient presents with  . Abdominal Pain    HPI [name] is a 35 y.o. female hx of anxiety, previous C section, Here presenting with right lower quadrant pain. Patient states that she has right lower quadrant pain since yesterday. Patient states that it is constant and nonradiating, dull ache. No associated vomiting or fevers or diarrhea or constipation. About 2 days ago, she thought she had a yeast vaginal infection and use Monistat and the discharge has resolved. She is currently sexually active but denies any history of STD. Patient denies any history of ovarian cyst and never had this kind of pain before. Never had appendectomy in the past.   The history is provided by the patient.    Past Medical History:  Diagnosis Date  . Abnormal Pap smear   . Anxiety   . Arthritis     There are no active problems to display for this patient.   Past Surgical History:  Procedure Laterality Date  . CESAREAN SECTION      X 3  . DILATION AND CURETTAGE OF UTERUS  2004/2007/2009  . DILATION AND EVACUATION    . LEEP    . TUBAL LIGATION    . TUBAL LIGATION  06/12/10  . WISDOM TOOTH EXTRACTION      OB History    Gravida Para Term Preterm AB Living   3 3           SAB TAB Ectopic Multiple Live Births                   Home Medications    Prior to Admission medications   Medication Sig Start Date End Date Taking? Authorizing Provider  Acetaminophen (TYLENOL PO) Take by mouth.    Historical Provider, MD  esomeprazole (NEXIUM) 40 MG capsule Take 40 mg by mouth daily before breakfast.      Historical Provider, MD  levonorgestrel (MIRENA) 20 MCG/24HR IUD 1 each by Intrauterine route once.    Historical Provider, MD  methocarbamol (ROBAXIN-750) 750 MG tablet Take 1 tablet (750 mg total) by mouth 4 (four) times daily. 01/01/16   Joni Reiningonald  K Smith, PA-C  metroNIDAZOLE (FLAGYL) 500 MG tablet Take one tablet two times daily for 5 days 11/13/11   Reva Boresanya S Pratt, MD  oxyCODONE-acetaminophen (ROXICET) 5-325 MG tablet Take 1 tablet by mouth every 6 (six) hours as needed for moderate pain. 01/01/16   Joni Reiningonald K Smith, PA-C    Family History Family History  Problem Relation Age of Onset  . Arthritis Mother   . Arthritis Maternal Grandmother     Social History Social History  Substance Use Topics  . Smoking status: Former Smoker    Types: Cigarettes    Quit date: 06/11/2010  . Smokeless tobacco: Never Used  . Alcohol use No     Allergies   Sulfa antibiotics   Review of Systems Review of Systems  Gastrointestinal: Positive for abdominal pain.  All other systems reviewed and are negative.    Physical Exam Updated Vital Signs BP 118/76   Pulse 81   Temp 98.2 F (36.8 C) (Oral)   Resp 18   Ht 5\' 5"  (1.651 m)   Wt 200 lb (90.7 kg)   LMP 12/28/2015   SpO2 96%   BMI 33.28 kg/m   Physical Exam  Constitutional: She  is oriented to person, place, and time. She appears well-developed.  Slightly uncomfortable   HENT:  Head: Normocephalic.  Mouth/Throat: Oropharynx is clear and moist.  Eyes: EOM are normal. Pupils are equal, round, and reactive to light.  Neck: Normal range of motion.  Cardiovascular: Normal rate, regular rhythm and normal heart sounds.   Pulmonary/Chest: Effort normal and breath sounds normal. No respiratory distress. She has no wheezes.  Abdominal: Soft. Bowel sounds are normal.  + RLQ tenderness, no rebound. No CVAT   Musculoskeletal: Normal range of motion.  Neurological: She is alert and oriented to person, place, and time. No cranial nerve deficit. Coordination normal.  Skin: Skin is warm.  Psychiatric: She has a normal mood and affect.  Nursing note and vitals reviewed.    ED Treatments / Results  Labs (all labs ordered are listed, but only abnormal results are displayed) Labs  Reviewed  WET PREP, GENITAL - Abnormal; Notable for the following:       Result Value   WBC, Wet Prep HPF POC FEW (*)    All other components within normal limits  COMPREHENSIVE METABOLIC PANEL - Abnormal; Notable for the following:    Calcium 8.8 (*)    AST 14 (*)    Anion gap 3 (*)    All other components within normal limits  URINALYSIS, COMPLETE (UACMP) WITH MICROSCOPIC - Abnormal; Notable for the following:    Color, Urine YELLOW (*)    APPearance CLEAR (*)    Squamous Epithelial / LPF 0-5 (*)    All other components within normal limits  CHLAMYDIA/NGC RT PCR ([facility] ONLY)  LIPASE, BLOOD  CBC  POC URINE PREG, ED  POCT PREGNANCY, URINE    EKG  EKG Interpretation None       Radiology US Transvaginal Non-ob  Result Date: 01/08/2016 CLINICAL DATA:  Right adnexal tenderness EXAM: TRANSABDOMINAL AND TRANSVAGINAL ULTRASOUND OF PELVIS DOPPLER ULTRASOUND OF OVARIES TECHNIQUE: Both transabdominal and transvaginal ultrasound examinations of the pelvis were performed. Transabdominal technique was performed for global imaging of the pelvis including uterus, ovaries, adnexal regions, and pelvic cul-de-sac. It was necessary to proceed with endovaginal exam following the transabdominal exam to visualize the bilateral ovaries. Color and duplex Doppler ultrasound was utilized to evaluate blood flow to the ovaries. COMPARISON:  CT 01/08/2016 FINDINGS: Uterus Measurements: 10.1 x 5 x 6.1 cm. No fibroids visualized. Multiple nabothian cysts. Endometrium Thickness: 12.4 mm.  No focal abnormality visualized. Right ovary Measurements: 3.6 x 2.7 x 2.3 cm. Normal appearance/no adnexal mass. Left ovary Measurements: 3 x 2.0 x 1.6 cm. Normal appearance/no adnexal mass. Pulsed Doppler evaluation of both ovaries demonstrates normal low-resistance arterial and venous waveforms. Other findings No abnormal free fluid. IMPRESSION: 1. No sonographic evidence for ovarian torsion or ovarian mass. 2.  Negative examination Electronically Signed   By: Jasmine Pang M.D.   On: 01/08/2016 14:10   US Pelvis Complete  Result Date: 01/08/2016 CLINICAL DATA:  Right adnexal tenderness EXAM: TRANSABDOMINAL AND TRANSVAGINAL ULTRASOUND OF PELVIS DOPPLER ULTRASOUND OF OVARIES TECHNIQUE: Both transabdominal and transvaginal ultrasound examinations of the pelvis were performed. Transabdominal technique was performed for global imaging of the pelvis including uterus, ovaries, adnexal regions, and pelvic cul-de-sac. It was necessary to proceed with endovaginal exam following the transabdominal exam to visualize the bilateral ovaries. Color and duplex Doppler ultrasound was utilized to evaluate blood flow to the ovaries. COMPARISON:  CT 01/08/2016 FINDINGS: Uterus Measurements: 10.1 x 5 x 6.1 cm. No fibroids visualized. Multiple nabothian cysts. Endometrium  Thickness: 12.4 mm.  No focal abnormality visualized. Right ovary Measurements: 3.6 x 2.7 x 2.3 cm. Normal appearance/no adnexal mass. Left ovary Measurements: 3 x 2.0 x 1.6 cm. Normal appearance/no adnexal mass. Pulsed Doppler evaluation of both ovaries demonstrates normal low-resistance arterial and venous waveforms. Other findings No abnormal free fluid. IMPRESSION: 1. No sonographic evidence for ovarian torsion or ovarian mass. 2. Negative examination Electronically Signed   By: Jasmine Pang M.D.   On: 01/08/2016 14:10   Ct Abdomen Pelvis W Contrast  Result Date: 01/08/2016 CLINICAL DATA:  35 year old female with right lower quadrant abdominal pain for 24 hours. Initial encounter. Prior C-section. EXAM: CT ABDOMEN AND PELVIS WITH CONTRAST TECHNIQUE: Multidetector CT imaging of the abdomen and pelvis was performed using the standard protocol following bolus administration of intravenous contrast. CONTRAST:  ISOVUE-300 IOPAMIDOL (ISOVUE-300) INJECTION 61% COMPARISON:  KUB 07/31/2006. FINDINGS: Lower chest: Negative lung bases. No pericardial or pleural  effusion. No upper abdominal free air. Hepatobiliary: Negative liver and gallbladder. Pancreas: Negative. Spleen: Negative. Adrenals/Urinary Tract: Normal adrenal glands. Normal bilateral renal enhancement. No pararenal stranding. Negative course of the right ureter. No nephrolithiasis identified. Tiny pelvic phleboliths. Unremarkable urinary bladder. Stomach/Bowel: Redundant but otherwise negative rectosigmoid colon. Negative left colon. Mild retained stool in the transverse colon. Negative right colon. Normal retrocecal appendix (series 2, image 61). Negative terminal ileum. Oral contrast has not yet reached the distal small bowel. No dilated or inflamed small bowel loops. Negative stomach and duodenum. No mesenteric stranding. Vascular/Lymphatic: Major arterial structures are patent and appear normal. Portal venous system is patent. New line no lymphadenopathy. Reproductive: Bilateral tubal ligation clips, otherwise negative. Other: No abdominal or pelvic free fluid. Musculoskeletal: Incidental lower thoracic spina bifida occulta (normal variant). Mild degenerative changes along the posterior inferior left SI joint. No acute osseous abnormality identified. IMPRESSION: Normal appendix. No acute or inflammatory process identified in the abdomen or pelvis. Electronically Signed   By: Odessa Fleming M.D.   On: 01/08/2016 12:24   [place] Art/ven Flow Abd Pelv Doppler  Result Date: 01/08/2016 CLINICAL DATA:  Right adnexal tenderness EXAM: TRANSABDOMINAL AND TRANSVAGINAL ULTRASOUND OF PELVIS DOPPLER ULTRASOUND OF OVARIES TECHNIQUE: Both transabdominal and transvaginal ultrasound examinations of the pelvis were performed. Transabdominal technique was performed for global imaging of the pelvis including uterus, ovaries, adnexal regions, and pelvic cul-de-sac. It was necessary to proceed with endovaginal exam following the transabdominal exam to visualize the bilateral ovaries. Color and duplex Doppler ultrasound was utilized to  evaluate blood flow to the ovaries. COMPARISON:  CT 01/08/2016 FINDINGS: Uterus Measurements: 10.1 x 5 x 6.1 cm. No fibroids visualized. Multiple nabothian cysts. Endometrium Thickness: 12.4 mm.  No focal abnormality visualized. Right ovary Measurements: 3.6 x 2.7 x 2.3 cm. Normal appearance/no adnexal mass. Left ovary Measurements: 3 x 2.0 x 1.6 cm. Normal appearance/no adnexal mass. Pulsed Doppler evaluation of both ovaries demonstrates normal low-resistance arterial and venous waveforms. Other findings No abnormal free fluid. IMPRESSION: 1. No sonographic evidence for ovarian torsion or ovarian mass. 2. Negative examination Electronically Signed   By: Jasmine Pang M.D.   On: 01/08/2016 14:10    Procedures Procedures (including critical care time)  Medications Ordered in ED Medications  morphine 4 MG/ML injection 4 mg (4 mg Intravenous Given 01/08/16 1106)  sodium chloride 0.9 % bolus 1,000 mL (0 mLs Intravenous Stopped 01/08/16 1301)  iopamidol (ISOVUE-300) 61 % injection 30 mL (30 mLs Oral Contrast Given 01/08/16 1109)  iopamidol (ISOVUE-300) 61 % injection 100 mL (100 mLs  Intravenous Contrast Given 01/08/16 1207)  ketorolac (TORADOL) 30 MG/ML injection 30 mg (30 mg Intravenous Given 01/08/16 1301)     Initial Impression / Assessment and Plan / ED Course  I have reviewed the triage vital signs and the nursing notes.  Pertinent labs & imaging results that were available during my care of the patient were reviewed by me and considered in my medical decision making (see chart for details).  Clinical Course     [name] is a 35 y.o. female here with RLQ pain. Likely appy vs ruptured cyst. Will get CT ab/pel, labs, UA. If neg, will consider US transvag.   1 pm  CT unremarkable. Still in pain despite morphine. Will give toradol, do pelvic exam, get transvag US.   2:55 PM Pelvic exam showed mild uterine and R adnexal tenderness. Os closed. US showed no ovarian mass or torsion. Has  some nabothian cysts in the uterus. Suspect ruptured ovarian cyst. Pain controlled. Will dc home.    Final Clinical Impressions(s) / ED Diagnoses   Final diagnoses:  Right adnexal tenderness    New Prescriptions New Prescriptions   No medications on file     Charlynne Panderavid Hsienta Yao, MD 01/08/16 1457

## 2016-01-08 NOTE — ED Notes (Signed)
AAOx3.  Skin warm and dry. NAD.  Ambulates with easy and steady gait.   

## 2016-01-08 NOTE — Discharge Instructions (Signed)
Take tylenol for pain.   Take vicodin for severe pain. Do NOT drive with it.   See your doctor  You likely have a ruptured cyst and pain should improved in 1-2 days   Return to ER if you have severe abdominal pain, vomiting, fevers.

## 2016-01-08 NOTE — ED Notes (Signed)
Patient transported to ultrasound.

## 2017-02-06 IMAGING — US US TRANSVAGINAL NON-OB
1 series · 13 of 25 positions shown · non-contrast
Comparison: CT 01/08/2016

CLINICAL DATA: Right adnexal tenderness

EXAM:
TRANSABDOMINAL AND TRANSVAGINAL ULTRASOUND OF PELVIS
DOPPLER ULTRASOUND OF OVARIES
TECHNIQUE: Both transabdominal and transvaginal ultrasound examinations of the
pelvis were performed. Transabdominal technique was performed for
global imaging of the pelvis including uterus, ovaries, adnexal
regions, and pelvic cul-de-sac.
It was necessary to proceed with endovaginal exam following the
transabdominal exam to visualize the bilateral ovaries. Color and
duplex Doppler ultrasound was utilized to evaluate blood flow to the
ovaries.

[Series 1: us transvaginal non-ob · 0.23mm/px · 13 of 99 slices shown]
[im 1/99]
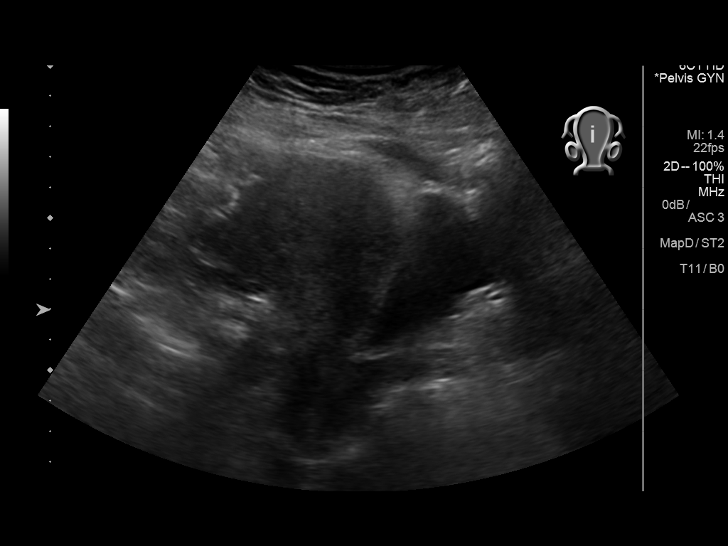
[im 9/99]
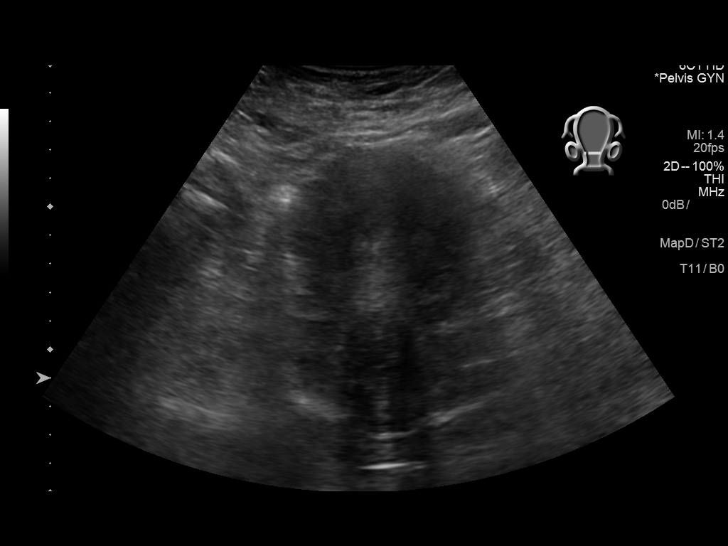
[im 17/99]
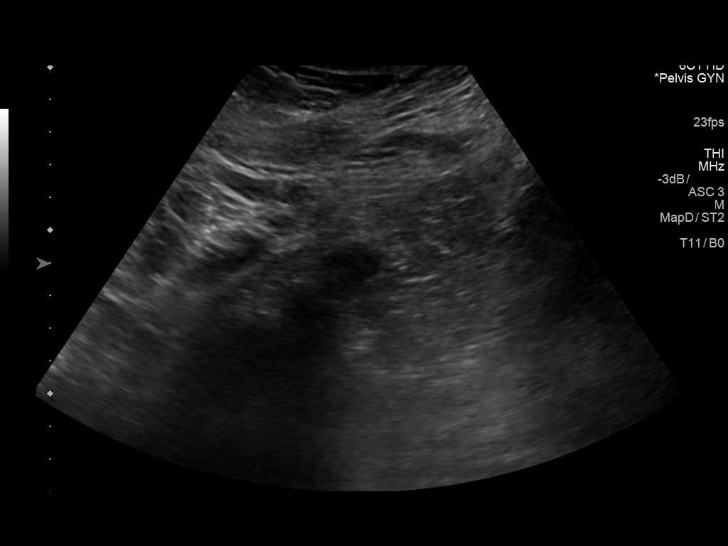
[im 25/99]
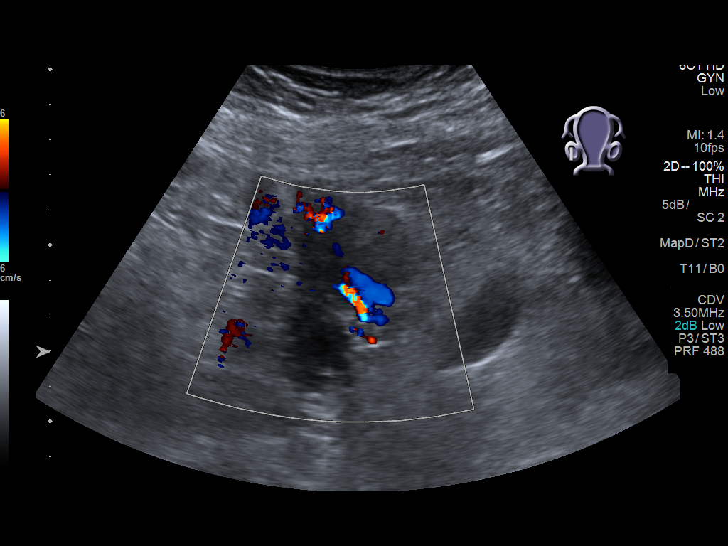
[im 33/99]
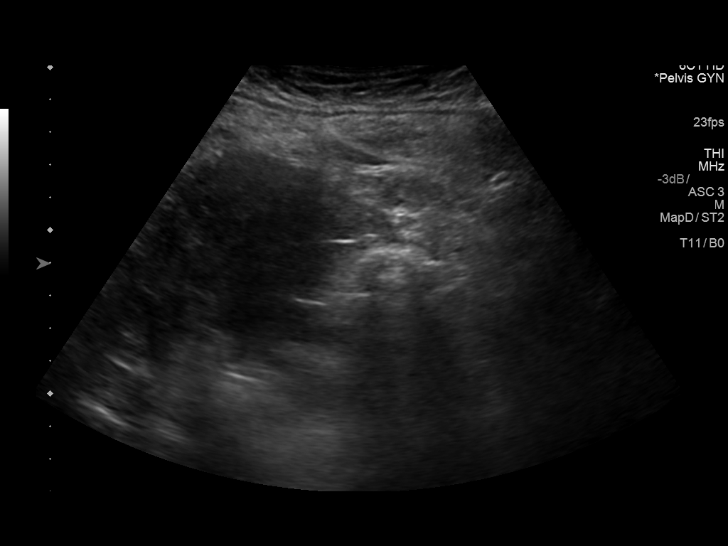
[im 41/99]
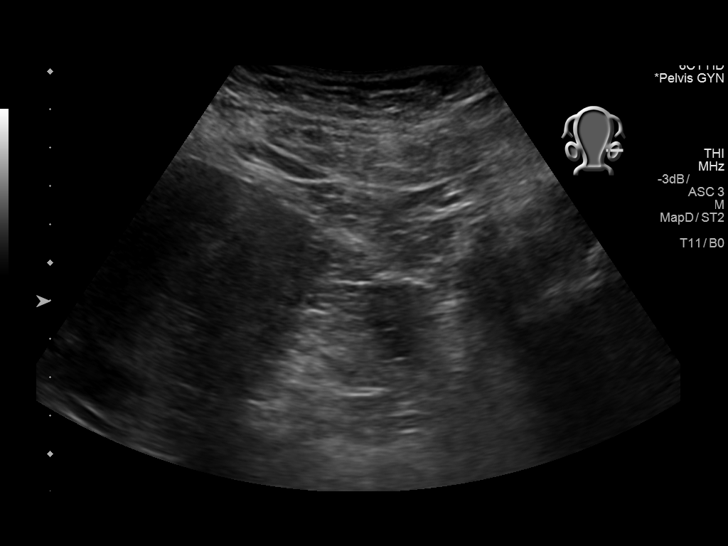
[im 50/99]
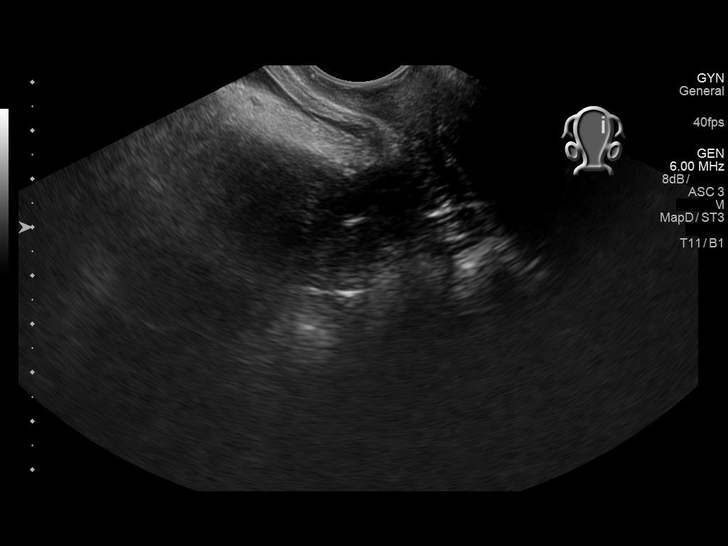
[im 58/99]
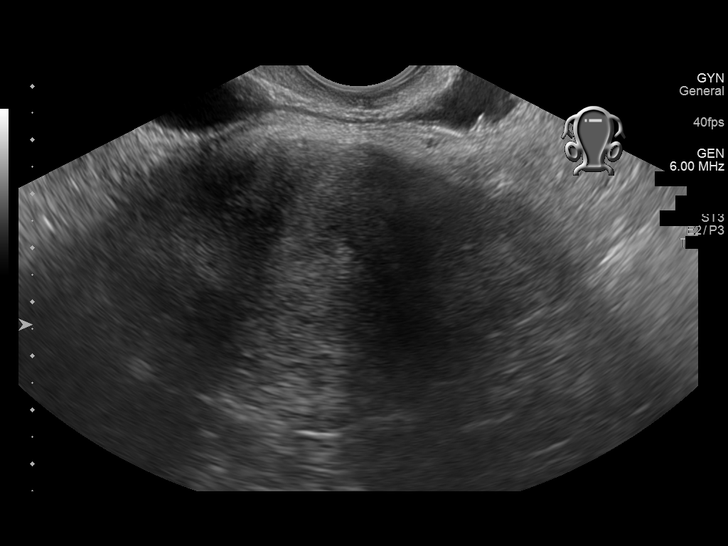
[im 66/99]
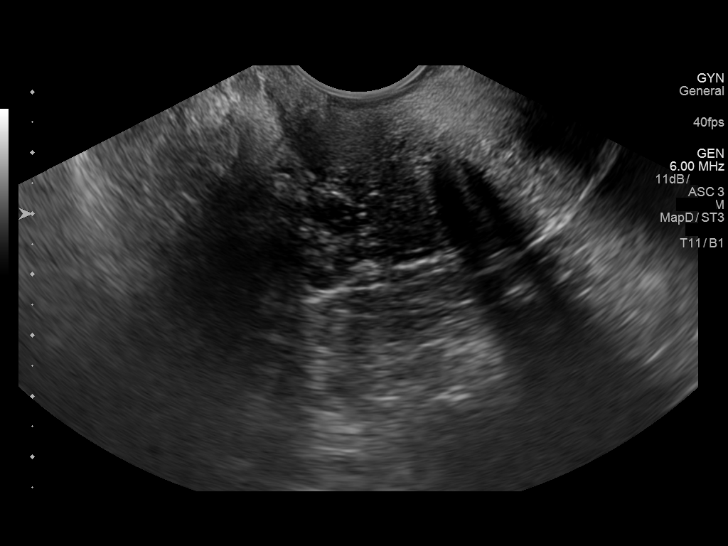
[im 74/99]
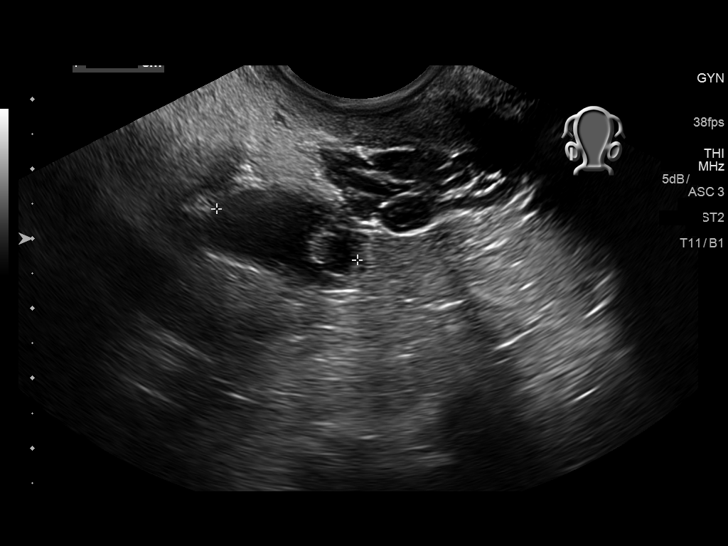
[im 82/99]
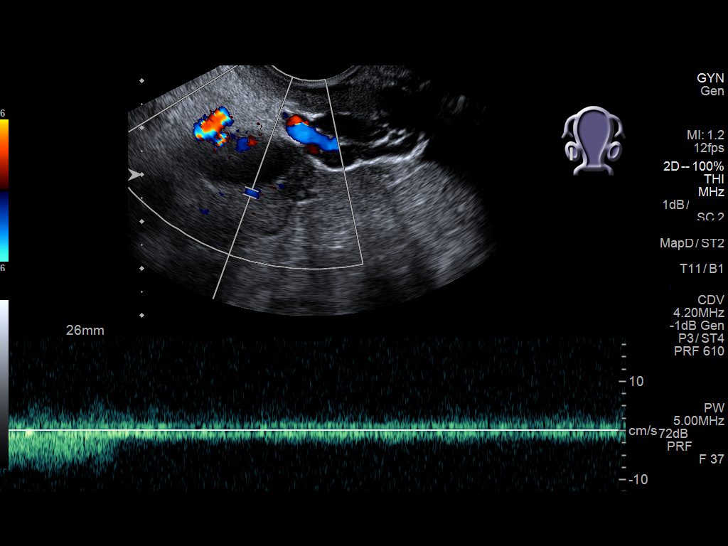
[im 90/99]
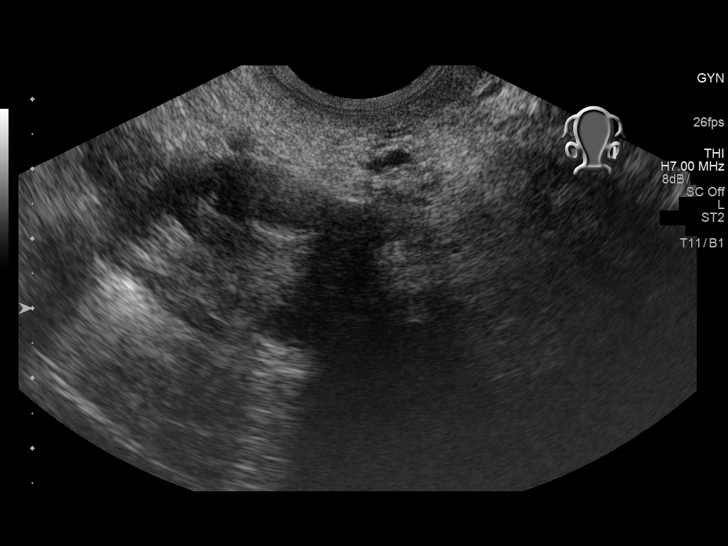
[im 99/99]
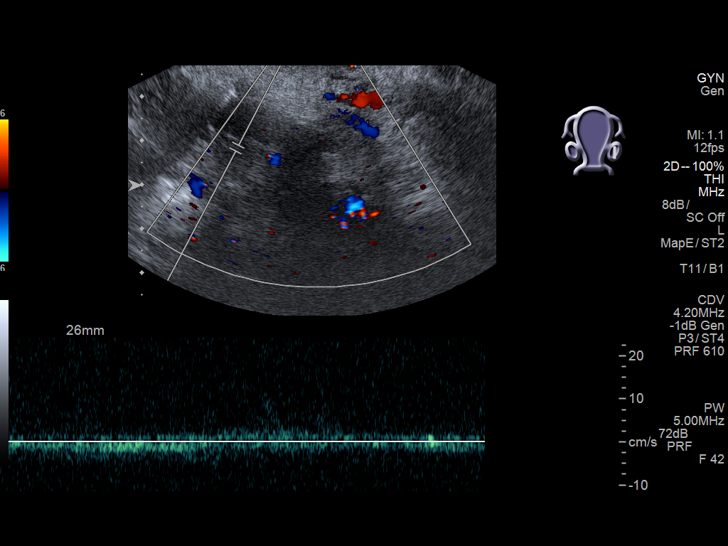

[13 of 25 positions shown; findings below may reference images not displayed]

FINDINGS: Uterus

Measurements: 10.1 x 5 x 6.1 cm.. No fibroids visualized. Multiple
nabothian cysts.

Endometrium

Thickness: 12.4 mm.  No focal abnormality visualized.

Right ovary

Measurements: 3.6 x 2.7 x 2.3 cm. Normal appearance/no adnexal mass.

Left ovary

Measurements: 3 x 2.0 x 1.6 cm. Normal appearance/no adnexal mass.

Pulsed Doppler evaluation of both ovaries demonstrates normal
low-resistance arterial and venous waveforms.

Other findings

No abnormal free fluid.
IMPRESSION: 1. No sonographic evidence for ovarian torsion or ovarian mass.
2. Negative examination

## 2019-10-23 ENCOUNTER — Emergency Department: Admission: EM | Admit: 2019-10-23 | Discharge: 2019-10-23 | Discharge status: Against Medical Advice | Payer: Self-pay

## 2020-10-13 ENCOUNTER — Ambulatory Visit: Payer: Self-pay | Admitting: Physician Assistant

## 2020-10-13 ENCOUNTER — Other Ambulatory Visit: Payer: Self-pay

## 2020-10-13 ENCOUNTER — Encounter: Payer: Self-pay | Admitting: Physician Assistant

## 2020-10-13 ENCOUNTER — Ambulatory Visit: Payer: Self-pay

## 2020-10-13 DIAGNOSIS — Z113 Encounter for screening for infections with a predominantly sexual mode of transmission: Secondary | ICD-10-CM

## 2020-10-13 LAB — WET PREP FOR TRICH, YEAST, CLUE
Trichomonas Exam: NEGATIVE
Yeast Exam: NEGATIVE

## 2020-10-13 NOTE — Progress Notes (Signed)
Pt here for STD screening.  Wet mount results reviewed, no treatment required per SO.  Pt declined condoms.  Brunette Lavalle M Zacari Radick, RN ° °

## 2020-10-13 NOTE — Progress Notes (Signed)
[facility] Department STI clinic/screening visit  Subjective:  [name] is a 40 y.o. female being seen today for an STI screening visit. The patient reports they do have symptoms.  Patient reports that they do not desire a pregnancy in the next year.   They reported they are not interested in discussing contraception today.  Patient's last menstrual period was 10/04/2020.   Patient has the following medical conditions:  There are no problems to display for this patient.   Chief Complaint  Patient presents with   SEXUALLY TRANSMITTED DISEASE    screening    HPI  Patient reports that she has had some vaginal itching for 3-4 days.  Reports that she has no chronic conditions and takes Tylenol and Prilosec as needed.  LMP 10/04/2020 and has had a BTL as her BCM.  Is not sure when last HIV test or last pap was done but states that she is sure that she is due.    See flowsheet for further details and programmatic requirements.    The following portions of the patient's history were reviewed and updated as appropriate: allergies, current medications, past medical history, past social history, past surgical history and problem list.  Objective:  There were no vitals filed for this visit.  Physical Exam Constitutional:      General: She is not in acute distress.    Appearance: Normal appearance.  HENT:     Head: Normocephalic and atraumatic.     Comments: No nits,lice, or hair loss. No cervical, supraclavicular or axillary adenopathy.     Mouth/Throat:     Mouth: Mucous membranes are moist.     Pharynx: Oropharynx is clear. No oropharyngeal exudate or posterior oropharyngeal erythema.  Eyes:     Conjunctiva/sclera: Conjunctivae normal.  Pulmonary:     Effort: Pulmonary effort is normal.  Abdominal:     Palpations: Abdomen is soft. There is no mass.     Tenderness: There is no abdominal tenderness. There is no guarding or rebound.  Genitourinary:    General:  Normal vulva.     Rectum: Normal.     Comments: External genitalia/pubic area without nits, lice, edema, erythema, lesions and inguinal adenopathy. Vagina with normal mucosa and discharge. Cervix without visible lesions. Uterus firm, mobile, nt, no masses, no CMT, no adnexal tenderness or fullness.  Musculoskeletal:     Cervical back: Neck supple. No tenderness.  Skin:    General: Skin is warm and dry.     Findings: No bruising, erythema, lesion or rash.  Neurological:     Mental Status: She is alert and oriented to person, place, and time.  Psychiatric:        Mood and Affect: Mood normal.        Behavior: Behavior normal.        Thought Content: Thought content normal.        Judgment: Judgment normal.     Assessment and Plan:  [name] is a 40 y.o. female presenting to the [facility] Department for STI screening  1. Screening for STD (sexually transmitted disease) Patient into clinic with symptoms. Reviewed wet mount results and that no treatment is indicated today. Rec condoms with all sex. Await test results.  Counseled that RN will call if needs to RTC for treatment once results are back.  PCP list given to establish care for regular PE and pap. - WET PREP FOR TRICH, YEAST, CLUE - Gonococcus culture - Chlamydia/Gonorrhea [place]  Lab - HIV [place] [facility] - Syphilis Serology, [place] [facility]     No follow-ups on file.  No future appointments.  Matt Holmes, PA

## 2020-10-17 LAB — GONOCOCCUS CULTURE

## 2020-10-24 ENCOUNTER — Encounter: Payer: Self-pay | Admitting: Physician Assistant

## 2021-10-15 ENCOUNTER — Encounter: Payer: Self-pay | Admitting: Nurse Practitioner

## 2021-10-15 ENCOUNTER — Ambulatory Visit: Payer: Self-pay | Admitting: Nurse Practitioner

## 2021-10-15 DIAGNOSIS — Z113 Encounter for screening for infections with a predominantly sexual mode of transmission: Secondary | ICD-10-CM

## 2021-10-15 LAB — WET PREP FOR TRICH, YEAST, CLUE
Trichomonas Exam: NEGATIVE
Yeast Exam: NEGATIVE

## 2021-10-15 NOTE — Progress Notes (Addendum)
[facility] Department  STI clinic/screening visit [place] [address] [phone]  Subjective:  [name] is a 41 y.o. female being seen today for an STI screening visit. The patient reports they do have symptoms.  Patient reports that they do not desire a pregnancy in the next year.   They reported they are not interested in discussing contraception today.  Patient had a history of tubal ligation.    Patient's last menstrual period was 10/10/2021 (exact date).   Patient has the following medical conditions:  There are no problems to display for this patient.   Chief Complaint  Patient presents with   SEXUALLY TRANSMITTED DISEASE    Noticed about a week ago a little odor just after sex, and some clear discharge.     HPI  Patient reports to clinic for STD screening.  Patient reports some genital itching and discharge the started a couple of weeks ago.  Patient states she is beginning to see symptoms subsided but still desires screening today.   Last HIV test per patient/review of record was 10/13/20 Patient reports last pap was 04/03/2011  Screening for MPX risk: Does the patient have an unexplained rash? No Is the patient MSM? No Does the patient endorse multiple sex partners or anonymous sex partners? No Did the patient have close or sexual contact with a person diagnosed with MPX? No Has the patient traveled outside the [place] where MPX is endemic? No Is there a high clinical suspicion for MPX-- evidenced by one of the following No  -Unlikely to be chickenpox  -Lymphadenopathy  -Rash that present in same phase of evolution on any given body part See flowsheet for further details and programmatic requirements.   Immunization history:   There is no immunization history on file for this patient.   The following portions of the patient's history were reviewed and updated as appropriate: allergies, current medications, past medical  history, past social history, past surgical history and problem list.  Objective:  There were no vitals filed for this visit.  Physical Exam Constitutional:      Appearance: Normal appearance.  HENT:     Head: Normocephalic. No abrasion, masses or laceration. Hair is normal.     Right Ear: External ear normal.     Left Ear: External ear normal.     Nose: Nose normal.     Mouth/Throat:     Lips: Pink.     Mouth: Mucous membranes are moist. No oral lesions.     Pharynx: No oropharyngeal exudate or posterior oropharyngeal erythema.     Tonsils: No tonsillar exudate or tonsillar abscesses.     Comments: No visible signs of dental caries  Eyes:     General: Lids are normal.        Right eye: No discharge.        Left eye: No discharge.     Conjunctiva/sclera: Conjunctivae normal.     Right eye: No exudate.    Left eye: No exudate. Abdominal:     General: Abdomen is flat.     Palpations: Abdomen is soft.     Tenderness: There is no abdominal tenderness. There is no rebound.  Genitourinary:    Pubic Area: No rash or pubic lice.      Labia:        Right: No rash, tenderness, lesion or injury.        Left: No rash, tenderness, lesion or injury.  Vagina: Normal. No vaginal discharge, erythema or lesions.     Cervix: No cervical motion tenderness, discharge, lesion or erythema.     Uterus: Not enlarged and not tender.      Rectum: Normal.     Comments: Amount Discharge: small  Odor: No pH: less than 4.5 Adheres to vaginal wall: No Color: color of discharge matches the Ysidro Ramsay swab  Musculoskeletal:     Cervical back: Full passive range of motion without pain, normal range of motion and neck supple.  Lymphadenopathy:     Cervical: No cervical adenopathy.     Right cervical: No superficial, deep or posterior cervical adenopathy.    Left cervical: No superficial, deep or posterior cervical adenopathy.     Upper Body:     Right upper body: No supraclavicular, axillary or  epitrochlear adenopathy.     Left upper body: No supraclavicular, axillary or epitrochlear adenopathy.     Lower Body: No right inguinal adenopathy. No left inguinal adenopathy.  Skin:    General: Skin is warm and dry.     Findings: No lesion or rash.  Neurological:     Mental Status: She is alert and oriented to person, place, and time.  Psychiatric:        Attention and Perception: Attention normal.        Mood and Affect: Mood normal.        Speech: Speech normal.        Behavior: Behavior normal. Behavior is cooperative.     Assessment and Plan:  [name] is a 41 y.o. female presenting to the [facility] Department for STI screening  1. Screening examination for venereal disease -41 year old female in clinic today for STD screening. -Patient accepted all screenings including oral, vaginal CT/GC, wet prep and declines bloodwork for HIV/RPR.  Patient meets criteria for HepB screening? No. Ordered? No - low risk  Patient meets criteria for HepC screening? Yes. Ordered? No - refused  Treat wet prep per standing order Discussed time line for [facility] results and that patient will be called with positive results and encouraged patient to call if she had not heard in 2 weeks.  Counseled to return or seek care for continued or worsening symptoms Recommended condom use with all sex  Patient is currently using Sterilization by Laparoscopy to prevent pregnancy.    -Patient reports left breast pain and discomfort.  Pain is more noticeable with palpation.  No nodules felt on exam.  Patient advised to start wearing sports bras or bras without underwire and take Vitamin E daily to see if any improvement.  Patient also given [facility] brochure for follow up.    - Chlamydia/Gonorrhea [facility] - Chlamydia/Gonorrhea [facility] - WET PREP FOR TRICH, YEAST, CLUE  Total time spent: 30 minutes    Return if symptoms worsen or fail to improve.    Glenna Fellows, FNP

## 2021-11-14 ENCOUNTER — Other Ambulatory Visit: Payer: Self-pay

## 2021-11-14 DIAGNOSIS — N644 Mastodynia: Secondary | ICD-10-CM

## 2021-12-18 ENCOUNTER — Ambulatory Visit
Admission: RE | Admit: 2021-12-18 | Discharge: 2021-12-18 | Disposition: A | Discharge status: Home / Self Care | Payer: Self-pay | Source: Ambulatory Visit | Attending: Obstetrics and Gynecology | Admitting: Obstetrics and Gynecology

## 2021-12-18 ENCOUNTER — Other Ambulatory Visit: Payer: Self-pay

## 2021-12-18 ENCOUNTER — Ambulatory Visit: Payer: Self-pay | Attending: Hematology and Oncology | Admitting: Hematology and Oncology

## 2021-12-18 VITALS — BP 122/82 | Wt 215.2 lb

## 2021-12-18 DIAGNOSIS — N644 Mastodynia: Secondary | ICD-10-CM

## 2021-12-18 DIAGNOSIS — Z01419 Encounter for gynecological examination (general) (routine) without abnormal findings: Secondary | ICD-10-CM

## 2021-12-18 NOTE — Progress Notes (Signed)
[name] is a 41 y.o. G3P3 female who presents to [facility] clinic today with complaint of left breast pain off and on x 1 year.    Pap Smear: Pap smear completed today. Last Pap smear was 2016 at [facility] clinic and was normal. Per patient has no history of an abnormal Pap smear. Last Pap smear result is available in Epic.   Physical exam: Breasts Breasts symmetrical. No skin abnormalities bilateral breasts. No nipple retraction bilateral breasts. No nipple discharge bilateral breasts. No lymphadenopathy. No lumps palpated bilateral breasts.        Pelvic/Bimanual Ext Genitalia No lesions, no swelling and no discharge observed on external genitalia.        Vagina Vagina pink and normal texture. No lesions or discharge observed in vagina.        Cervix Cervix is present. Cervix pink and of normal texture. No discharge observed.    Uterus Uterus is present and palpable. Uterus in normal position and normal size.        Adnexae Bilateral ovaries present and palpable. No tenderness on palpation.         Rectovaginal No rectal exam completed today since patient had no rectal complaints. No skin abnormalities observed on exam.     Smoking History: Patient has never smoked and was not referred to quit line.    Patient Navigation: Patient education provided. Access to services provided for patient through [facility] program. No interpreter provided. No transportation provided   Colorectal Cancer Screening: Per patient has never had colonoscopy completed No complaints today.    Breast and Cervical Cancer Risk Assessment: Patient does not have family history of breast cancer, known genetic mutations, or radiation treatment to the chest before age 63. Patient does not have history of cervical dysplasia, immunocompromised, or DES exposure in-utero.  Risk Scores as of 12/18/2021     [name]           5-year 0.48 %   Lifetime 7.2 %            Last calculated by Narda Rutherford, LPN on 38/18/2993 at 12:54 PM        A: [facility] exam with pap smear Complaint of left breast pain that comes and goes for about 1 year. Benign exam.   P: [facility]. Appointment scheduled 12/18/21.  Pascal Lux, NP 12/18/2021 1:07 PM

## 2021-12-18 NOTE — Patient Instructions (Signed)
Taught Marc Morgans [place] about self breast awareness. Patient did need a Pap smear today due to last Pap smear was in 2016 per patient. Let her know [facility] will cover Pap smears every 5 years unless has a history of abnormal Pap smears. Referred [facility] to the [facility] for [facility]. Appointment scheduled for 12/18/21. Patient aware of appointment and will be there. Let patient know will follow up with her within the next couple weeks with results. Marc Morgans Munger verbalized understanding. She will return next year for mammogram and Pap in 2028.  Pascal Lux, NP 1:10 PM

## 2021-12-20 LAB — CYTOLOGY - PAP
Adequacy: ABSENT
Comment: NEGATIVE
Diagnosis: NEGATIVE
High risk HPV: NEGATIVE

## 2021-12-27 ENCOUNTER — Other Ambulatory Visit: Payer: Self-pay

## 2021-12-27 MED ORDER — METRONIDAZOLE 500 MG PO TABS: 500.0000 mg | ORAL_TABLET | Freq: Two times a day (BID) | ORAL | 0 refills | Status: DC

## 2022-02-02 ENCOUNTER — Other Ambulatory Visit: Payer: Self-pay | Admitting: Obstetrics and Gynecology

## 2022-02-04 ENCOUNTER — Telehealth: Payer: Self-pay

## 2022-02-04 ENCOUNTER — Other Ambulatory Visit: Payer: Self-pay | Admitting: Hematology and Oncology

## 2022-02-04 ENCOUNTER — Encounter: Payer: Self-pay | Admitting: Hematology and Oncology

## 2022-02-04 MED ORDER — METRONIDAZOLE 0.75 % EX GEL: 1.0000 | Freq: Two times a day (BID) | CUTANEOUS | 0 refills | Status: DC

## 2022-02-04 NOTE — Telephone Encounter (Signed)
Via Mychart, patient requested refill metronidazole pills. Patient stated that symptoms (discharge, vaginal odor), cleared x 1 week, then returned. Per Dayton Scrape, NP, rx metrogel sent to [facility]. Patient verbalized understanding.

## 2022-02-05 ENCOUNTER — Other Ambulatory Visit: Payer: Self-pay | Admitting: Hematology and Oncology

## 2022-02-05 MED ORDER — VAGINAL SUPPOSITORY APPLICATOR MISC: 1.0000 | Freq: Two times a day (BID) | 0 refills | Status: DC

## 2022-03-08 ENCOUNTER — Other Ambulatory Visit: Payer: Self-pay

## 2022-03-08 ENCOUNTER — Emergency Department
Admission: EM | Admit: 2022-03-08 | Discharge: 2022-03-08 | Disposition: A | Discharge status: Home / Self Care | Payer: Self-pay | Attending: Emergency Medicine | Admitting: Emergency Medicine

## 2022-03-08 DIAGNOSIS — Z23 Encounter for immunization: Secondary | ICD-10-CM | POA: Insufficient documentation

## 2022-03-08 DIAGNOSIS — S61216A Laceration without foreign body of right little finger without damage to nail, initial encounter: Secondary | ICD-10-CM | POA: Insufficient documentation

## 2022-03-08 DIAGNOSIS — W269XXA Contact with unspecified sharp object(s), initial encounter: Secondary | ICD-10-CM | POA: Insufficient documentation

## 2022-03-08 MED ORDER — TETANUS-DIPHTH-ACELL PERTUSSIS 5-2.5-18.5 LF-MCG/0.5 IM SUSY
0.5000 mL | PREFILLED_SYRINGE | Freq: Once | INTRAMUSCULAR | Status: AC
  Administered 2022-03-08: 0.5 mL via INTRAMUSCULAR
  Filled 2022-03-08: qty 0.5

## 2022-03-08 MED ORDER — LIDOCAINE HCL (PF) 1 % IJ SOLN
5.0000 mL | Freq: Once | INTRAMUSCULAR | Status: AC
  Administered 2022-03-08: 5 mL
  Filled 2022-03-08: qty 5

## 2022-03-08 NOTE — ED Provider Notes (Signed)
[facility] Emergency Department Provider Note     Event Date/Time   First MD Initiated Contact with Patient 03/08/22 1744     (approximate)   History   Laceration   HPI  [name] is a 42 y.o. female presents to the ED for evaluation of axonal laceration to the right pinky.  Patient reports injury occurred just before arrival, went someplace that she was holding, shattered, causing irregular flap laceration to the lateral aspect of her pinky.  She had difficulty controlling the bleeding, prior to arrival.  She is unclear of her current tetanus status, noting that it was likely in 2011 when she had her last child.   Physical Exam   Triage Vital Signs: ED Triage Vitals  Enc Vitals Group     BP 03/08/22 1702 (!) 132/91     Pulse Rate 03/08/22 1702 86     Resp 03/08/22 1702 18     Temp 03/08/22 1702 98 F (36.7 C)     Temp src --      SpO2 03/08/22 1702 97 %     Weight --      Height --      Head Circumference --      Peak Flow --      Pain Score 03/08/22 1655 2     Pain Loc --      Pain Edu? --      Excl. in [place]? --     Most recent vital signs: Vitals:   03/08/22 1702  BP: (!) 132/91  Pulse: 86  Resp: 18  Temp: 98 F (36.7 C)  SpO2: 97%    General Awake, no distress. NAD CV:  Good peripheral perfusion.  RESP:  Normal effort.  ABD:  No distention.  MSK:  Normal composite fist on the right.  Lateral flap laceration measuring approximately 2 cm.  No nail injury or avulsion noted. NEURO: Cranial nerves II to XII grossly intact.  Normal gross sensation noted.   ED Results / Procedures / Treatments   Labs (all labs ordered are listed, but only abnormal results are displayed) Labs Reviewed - No data to display   EKG   RADIOLOGY   No results found.   PROCEDURES:  Critical Care performed: No  ..Laceration Repair  Date/Time: 03/08/2022 6:02 PM  Performed by: Melvenia Needles, PA-C Authorized by: Melvenia Needles, PA-C   Consent:    Consent obtained:  Verbal   Consent given by:  Patient   Risks, benefits, and alternatives were discussed: yes     Risks discussed:  Pain and poor wound healing Universal protocol:    Site/side marked: yes     Patient identity confirmed:  Verbally with patient Anesthesia:    Anesthesia method:  Local infiltration   Local anesthetic:  Lidocaine 1% w/o epi Laceration details:    Location:  Finger   Finger location:  L small finger   Length (cm):  2   Depth (mm):  5 Pre-procedure details:    Preparation:  Patient was prepped and draped in usual sterile fashion Exploration:    Limited defect created (wound extended): no     Hemostasis achieved with:  Direct pressure   Contaminated: no   Treatment:    Area cleansed with:  Povidone-iodine and saline   Amount of cleaning:  Standard   Irrigation solution:  Sterile saline   Irrigation volume:  10   Irrigation method:  Syringe  Debridement:  None   Undermining:  None   Scar revision: no   Skin repair:    Repair method:  Sutures   Suture size:  5-0   Suture material:  Nylon   Suture technique:  Simple interrupted   Number of sutures:  5 Approximation:    Approximation:  Close Repair type:    Repair type:  Simple Post-procedure details:    Dressing:  Non-adherent dressing   Procedure completion:  Tolerated well, no immediate complications    MEDICATIONS ORDERED IN ED: Medications  Tdap (BOOSTRIX) injection 0.5 mL (0.5 mLs Intramuscular Given 03/08/22 1827)  lidocaine (PF) (XYLOCAINE) 1 % injection 5 mL (5 mLs Infiltration Given 03/08/22 1826)     IMPRESSION / MDM / ASSESSMENT AND PLAN / ED COURSE  I reviewed the triage vital signs and the nursing notes.                              Differential diagnosis includes, but is not limited to, laceration, abrasion, nail avulsion  Patient's presentation is most consistent with acute, uncomplicated illness.  Patient's diagnosis is  consistent with finger laceration. Patient will be discharged home with wound care instructions and supplies. Patient is to follow up with local primary provider or urgent care for suture removal in 7 to 10 days, as needed or otherwise directed. Patient is given ED precautions to return to the ED for any worsening or new symptoms.     FINAL CLINICAL IMPRESSION(S) / ED DIAGNOSES   Final diagnoses:  Laceration of right little finger without foreign body without damage to nail, initial encounter     Rx / DC Orders   ED Discharge Orders     None        Note:  This document was prepared using Dragon voice recognition software and may include unintentional dictation errors.    Melvenia Needles, PA-C 03/08/22 Neta Mends, MD 03/08/22 1926

## 2022-03-08 NOTE — ED Triage Notes (Signed)
Pt comes with c/o pinkie laceration. Bandage in placed and bleeding controlled.

## 2022-03-08 NOTE — Discharge Instructions (Addendum)
Keep the wound clean, dry, and covered. Use mild soap and water to cleanse daily. See a local Urgent Care or clinic for suture removal in 7-10 days.

## 2022-04-16 ENCOUNTER — Encounter: Payer: Self-pay | Admitting: Hematology and Oncology

## 2022-04-16 ENCOUNTER — Other Ambulatory Visit: Payer: Self-pay | Admitting: Hematology and Oncology

## 2022-04-16 MED ORDER — FLUCONAZOLE 150 MG PO TABS: 150.0000 mg | ORAL_TABLET | Freq: Every day | ORAL | 0 refills | Status: DC

## 2022-06-12 ENCOUNTER — Other Ambulatory Visit: Payer: Self-pay | Admitting: Hematology and Oncology

## 2022-06-13 ENCOUNTER — Other Ambulatory Visit: Payer: Self-pay | Admitting: Hematology and Oncology

## 2022-06-19 ENCOUNTER — Other Ambulatory Visit: Payer: Self-pay | Admitting: Obstetrics and Gynecology

## 2022-06-28 ENCOUNTER — Other Ambulatory Visit: Payer: Self-pay | Admitting: Obstetrics and Gynecology

## 2022-10-15 ENCOUNTER — Other Ambulatory Visit: Payer: Self-pay | Admitting: Hematology and Oncology

## 2022-10-15 MED ORDER — VAGINAL SUPPOSITORY APPLICATOR MISC: 1.0000 | Freq: Two times a day (BID) | 0 refills | Status: DC

## 2022-10-15 MED ORDER — METRONIDAZOLE 500 MG PO TABS: 500.0000 mg | ORAL_TABLET | Freq: Two times a day (BID) | ORAL | 0 refills | Status: DC

## 2023-06-04 ENCOUNTER — Ambulatory Visit: Admitting: Student

## 2023-06-13 ENCOUNTER — Telehealth: Admitting: Physician Assistant

## 2023-06-13 DIAGNOSIS — B370 Candidal stomatitis: Secondary | ICD-10-CM

## 2023-06-13 MED ORDER — NYSTATIN 100000 UNIT/ML MT SUSP
5.0000 mL | Freq: Four times a day (QID) | OROMUCOSAL | 0 refills | Status: DC
Start: 2023-06-13 — End: 2023-12-02

## 2023-06-13 MED ORDER — FLUCONAZOLE 150 MG PO TABS
150.0000 mg | ORAL_TABLET | ORAL | 0 refills | Status: AC
Start: 2023-06-13 — End: ?

## 2023-06-13 NOTE — Patient Instructions (Signed)
 [name], thank you for joining [name] Kelp, PA-C for today's virtual visit.  While this provider is not your primary care provider (PCP), if your PCP is located in our provider database this encounter information will be shared with them immediately following your visit.   A [facility] MyChart account gives you access to today's visit and all your visits, tests, and labs performed at [facility] " click here if you don't have a [facility] MyChart account or go to [email or web address]  Consent: (Patient) [name] provided verbal consent for this virtual visit at the beginning of the encounter.  Current Medications:  Current Outpatient Medications:    fluconazole  (DIFLUCAN ) 150 MG tablet, Take 1 tablet (150 mg total) by mouth once a week., Disp: 2 tablet, Rfl: 0   nystatin (MYCOSTATIN) 100000 UNIT/ML suspension, Take 5 mLs (500,000 Units total) by mouth 4 (four) times daily., Disp: 120 mL, Rfl: 0   Acetaminophen  (TYLENOL  PO), Take by mouth., Disp: , Rfl:    esomeprazole (NEXIUM) 40 MG capsule, Take 40 mg by mouth daily before breakfast.   (Patient not taking: Reported on 10/13/2020), Disp: , Rfl:    HYDROcodone -acetaminophen  (NORCO/VICODIN) 5-325 MG tablet, Take 1 tablet by mouth every 6 (six) hours as needed for moderate pain., Disp: 5 tablet, Rfl: 0   levonorgestrel (MIRENA) 20 MCG/24HR IUD, 1 each by Intrauterine route once., Disp: , Rfl:    metroNIDAZOLE  (METROGEL ) 0.75 % gel, Apply 1 Application topically 2 (two) times daily. Use for 5 days., Disp: 45 g, Rfl: 0   Misc. Devices (VAGINAL SUPPOSITORY APPLICATOR) MISC, 1 applicator by Does not apply route 2 (two) times daily., Disp: 1 each, Rfl: 0   omeprazole (PRILOSEC) 20 MG capsule, Take 20 mg by mouth daily., Disp: , Rfl:    oxyCODONE -acetaminophen  (ROXICET) 5-325 MG tablet, Take 1 tablet by mouth every 6 (six) hours as needed for moderate pain., Disp: 12 tablet, Rfl: 0   Medications ordered in  this encounter:  Meds ordered this encounter  Medications   nystatin (MYCOSTATIN) 100000 UNIT/ML suspension    Sig: Take 5 mLs (500,000 Units total) by mouth 4 (four) times daily.    Dispense:  120 mL    Refill:  0    Supervising Provider:   Corine [name] [id number]   fluconazole  (DIFLUCAN ) 150 MG tablet    Sig: Take 1 tablet (150 mg total) by mouth once a week.    Dispense:  2 tablet    Refill:  0    Supervising Provider:   LAMPTEY, [name] O [[id number]]     *If you need refills on other medications prior to your next appointment, please contact your pharmacy*  Follow-Up: Call back or seek an in-person evaluation if the symptoms worsen or if the condition fails to improve as anticipated.  [facility] [phone]  Other Instructions Oral Thrush, Adult Oral thrush, also called oral candidiasis, is a fungal infection that develops in the mouth and throat and on the tongue. It causes white patches to form in the mouth and on the tongue. Many cases of thrush are mild, but this infection can also be serious. Olla Bevels can be a repeated (recurrent) problem for certain people who have a weak body defense system (immune system). The weakness can be caused by chronic illnesses, or by taking medicines that limit the body's ability to fight infection. If a person has difficulty fighting infection, the fungus that causes thrush can spread through the body.  This can cause life-threatening blood or organ infections. What are the causes? This condition is caused by a fungus (yeast) called Candida albicans. This fungus is normally present in small amounts in the mouth and on other mucous membranes. It usually causes no harm. If conditions are present that allow the fungus to grow without control, it invades surrounding tissues and becomes an infection. Other Candida species can also lead to thrush, though this is rare. What increases the risk? The following factors may make you more  likely to develop this condition: Having a weakened immune system. Being an older adult. Having diabetes, cancer, or HIV (human immunodeficiency virus). Having dry mouth (xerostomia). Being pregnant or breastfeeding. Having poor dental care, especially in those who have dentures. Using antibiotic or steroid medicines. What are the signs or symptoms? Symptoms of this condition can vary from mild and moderate to severe and persistent. Symptoms may include: A burning feeling in the mouth and throat. This can occur at the start of a thrush infection. White patches that stick to the mouth and tongue. The tissue around the patches may be red, raw, and painful. If rubbed (during tooth brushing, for example), the patches and the tissue of the mouth may bleed easily. A bad taste in the mouth or difficulty tasting foods. A cottony feeling in the mouth. Pain during eating and swallowing. Poor appetite. Cracking at the corners of the mouth. How is this diagnosed? This condition is diagnosed based on: A physical exam. Your medical history. How is this treated? This condition is treated with medicines called antifungals, which prevent the growth of fungi. These medicines are either applied directly to the affected area (topical) or swallowed (oral). The treatment will depend on the severity of the condition. Mild cases of thrush may be treated with an antifungal mouth rinse or lozenges. Treatment usually lasts about 14 days. Moderate to severe cases of thrush can be treated with oral antifungal medicine, if they have spread to the esophagus. A topical antifungal medicine may also be used. For some severe infections, treatment may need to continue for more than 14 days. Oral antifungal medicines are rarely used during pregnancy because they may be harmful to the unborn child. If you are pregnant, talk with your health care provider about options for treatment. Persistent or recurrent thrush. For cases of  thrush that do not go away or keep coming back: Treatment may be needed twice as long as the symptoms last. Treatment will include both oral and topical antifungal medicines. People with a weakened immune system can take an antifungal medicine on a continuous basis to prevent thrush infections. It is important to treat conditions that make a person more likely to get thrush, such as diabetes or HIV. Follow these instructions at home: Relieving soreness and discomfort To help reduce the discomfort of thrush: Drink cold liquids such as water or iced tea. Try flavored ice treats or frozen juices. Eat foods that are easy to swallow, such as gelatin, ice cream, or custard. Try drinking from a straw if the patches in your mouth are painful.  General instructions Take or use over-the-counter and prescription medicines only as told by your health care provider. Eat plain, unflavored yogurt as directed by your health care provider. Check the label to make sure the yogurt contains live cultures. This yogurt can help healthy bacteria grow in the mouth and can stop the growth of the fungus that causes thrush. If you wear dentures, remove the dentures before going to  bed, brush them vigorously, and soak them in a cleaning solution as directed by your health care provider. Rinse your mouth with a warm salt-water mixture several times a day. To make a salt-water mixture, dissolve -1 tsp (3-6 g) of salt in 1 cup (237 mL) of warm water. Contact a health care provider if: Your symptoms are getting worse or are not improving within 7 days of starting treatment. You have symptoms of a spreading infection, such as white patches on the skin outside of the mouth. You are breastfeeding your baby and you have redness and pain in the nipples. Summary Oral thrush, also called oral candidiasis, is a fungal infection that develops in the mouth and throat and on the tongue. It causes white patches to form in the mouth and  on the tongue. You are more likely to get this condition if you have a weakened immune system or an underlying condition, such as HIV, cancer, or diabetes. This condition is treated with medicines called antifungals, which prevent the growth of fungi. Contact a health care provider if your symptoms do not improve, or get worse, within 7 days of starting treatment. This information is not intended to replace advice given to you by your health care provider. Make sure you discuss any questions you have with your health care provider. Document Revised: 12/24/2021 Document Reviewed: 12/24/2021 Elsevier Patient Education  2024 Elsevier Inc.   If you have been instructed to have an in-person evaluation today at a local Urgent Care facility, please use the link below. It will take you to a list of all of our available [facility], including address, phone number and hours of operation. Please do not delay care.  [facility]  If you or a family member do not have a primary care provider, use the link below to schedule a visit and establish care. When you choose a [facility] primary care physician or advanced practice provider, you gain a long-term partner in health. Find a Primary Care Provider  Learn more about [facility]'s in-office and virtual care options: [place] - Get Care Now

## 2023-06-13 NOTE — Progress Notes (Signed)
 Virtual Visit Consent   [name], you are scheduled for a virtual visit with a [facility] provider today. Just as with appointments in the office, your consent must be obtained to participate. Your consent will be active for this visit and any virtual visit you may have with one of our providers in the next 365 days. If you have a MyChart account, a copy of this consent can be sent to you electronically.  As this is a virtual visit, video technology does not allow for your provider to perform a traditional examination. This may limit your provider's ability to fully assess your condition. If your provider identifies any concerns that need to be evaluated in person or the need to arrange testing (such as labs, EKG, etc.), we will make arrangements to do so. Although advances in technology are sophisticated, we cannot ensure that it will always work on either your end or our end. If the connection with a video visit is poor, the visit may have to be switched to a telephone visit. With either a video or telephone visit, we are not always able to ensure that we have a secure connection.  By engaging in this virtual visit, you consent to the provision of healthcare and authorize for your insurance to be billed (if applicable) for the services provided during this visit. Depending on your insurance coverage, you may receive a charge related to this service.  I need to obtain your verbal consent now. Are you willing to proceed with your visit today? [name] has provided verbal consent on 06/13/2023 for a virtual visit (video or telephone). [name], PA-C  Date: 06/13/2023 4:49 PM   Virtual Visit via Video Note   I, [name], connected with  [name]  ([id number], 06-Nov-1980) on 06/13/23 at  4:45 PM EDT by a video-enabled telemedicine application and verified that I am speaking with the correct person using two identifiers.  Location: Patient: Virtual Visit  Location Patient: Mobile Provider: Virtual Visit Location Provider: Home Office   I discussed the limitations of evaluation and management by telemedicine and the availability of in person appointments. The patient expressed understanding and agreed to proceed.    History of Present Illness: [name] is a 43 y.o. who identifies as a female who was assigned female at birth, and is being seen today for possible thrush.  HPI: Oral Pain  This is a new problem. The current episode started in the past 7 days (a few days). The problem occurs constantly. The problem has been gradually worsening. The pain is mild. Associated symptoms include thermal sensitivity (spicy foods and hot foods can irritate). Pertinent negatives include no difficulty swallowing, facial pain, fever, oral bleeding or sinus pressure. Associated symptoms comments: Burning tongue and feels like something is on it. She has tried nothing for the symptoms. The treatment provided no relief.   Husband diagnosed with Thrush.   Problems: There are no active problems to display for this patient.   Allergies:  Allergies  Allergen Reactions   Ibuprofen Other (See Comments)    Burning stomach Burning stomach    Sulfa Antibiotics Hives   Medications:  Current Outpatient Medications:    fluconazole  (DIFLUCAN ) 150 MG tablet, Take 1 tablet (150 mg total) by mouth once a week., Disp: 2 tablet, Rfl: 0   nystatin (MYCOSTATIN) 100000 UNIT/ML suspension, Take 5 mLs (500,000 Units total) by mouth 4 (four) times daily., Disp: 120 mL, Rfl: 0  Acetaminophen  (TYLENOL  PO), Take by mouth., Disp: , Rfl:    esomeprazole (NEXIUM) 40 MG capsule, Take 40 mg by mouth daily before breakfast.   (Patient not taking: Reported on 10/13/2020), Disp: , Rfl:    HYDROcodone -acetaminophen  (NORCO/VICODIN) 5-325 MG tablet, Take 1 tablet by mouth every 6 (six) hours as needed for moderate pain., Disp: 5 tablet, Rfl: 0   levonorgestrel (MIRENA) 20 MCG/24HR IUD,  1 each by Intrauterine route once., Disp: , Rfl:    metroNIDAZOLE  (METROGEL ) 0.75 % gel, Apply 1 Application topically 2 (two) times daily. Use for 5 days., Disp: 45 g, Rfl: 0   Misc. Devices (VAGINAL SUPPOSITORY APPLICATOR) MISC, 1 applicator by Does not apply route 2 (two) times daily., Disp: 1 each, Rfl: 0   omeprazole (PRILOSEC) 20 MG capsule, Take 20 mg by mouth daily., Disp: , Rfl:    oxyCODONE -acetaminophen  (ROXICET) 5-325 MG tablet, Take 1 tablet by mouth every 6 (six) hours as needed for moderate pain., Disp: 12 tablet, Rfl: 0  Observations/Objective: Patient is well-developed, well-nourished in no acute distress.  Resting comfortably  Head is normocephalic, atraumatic.  No labored breathing.  Speech is clear and coherent with logical content.  Patient is alert and oriented at baseline.    Assessment and Plan: 1. Oral thrush (Primary) - nystatin (MYCOSTATIN) 100000 UNIT/ML suspension; Take 5 mLs (500,000 Units total) by mouth 4 (four) times daily.  Dispense: 120 mL; Refill: 0 - fluconazole  (DIFLUCAN ) 150 MG tablet; Take 1 tablet (150 mg total) by mouth once a week.  Dispense: 2 tablet; Refill: 0  - Fluconazole  once weekly x 2 weeks - Nystatin 4 times daily - Salt water gargles - Seek in person evaluation if worsening or fails to resolve  Follow Up Instructions: I discussed the assessment and treatment plan with the patient. The patient was provided an opportunity to ask questions and all were answered. The patient agreed with the plan and demonstrated an understanding of the instructions.  A copy of instructions were sent to the patient via MyChart unless otherwise noted below.    The patient was advised to call back or seek an in-person evaluation if the symptoms worsen or if the condition fails to improve as anticipated.    [name], PA-C

## 2023-07-04 ENCOUNTER — Telehealth: Admitting: Physician Assistant

## 2023-07-04 DIAGNOSIS — N760 Acute vaginitis: Secondary | ICD-10-CM | POA: Diagnosis not present

## 2023-07-04 DIAGNOSIS — T3695XA Adverse effect of unspecified systemic antibiotic, initial encounter: Secondary | ICD-10-CM

## 2023-07-04 DIAGNOSIS — B9689 Other specified bacterial agents as the cause of diseases classified elsewhere: Secondary | ICD-10-CM

## 2023-07-04 DIAGNOSIS — B379 Candidiasis, unspecified: Secondary | ICD-10-CM | POA: Diagnosis not present

## 2023-07-04 MED ORDER — FLUCONAZOLE 150 MG PO TABS
150.0000 mg | ORAL_TABLET | ORAL | 0 refills | Status: DC | PRN
Start: 2023-07-04 — End: 2023-12-02

## 2023-07-04 MED ORDER — METRONIDAZOLE 0.75 % VA GEL
1.0000 | Freq: Every day | VAGINAL | 0 refills | Status: AC
Start: 2023-07-04 — End: 2023-07-11

## 2023-07-04 NOTE — Progress Notes (Signed)
 Virtual Visit Consent   Deon Flatter Kissel, you are scheduled for a virtual visit with a [facility] provider today. Just as with appointments in the office, your consent must be obtained to participate. Your consent will be active for this visit and any virtual visit you may have with one of our providers in the next 365 days. If you have a MyChart account, a copy of this consent can be sent to you electronically.  As this is a virtual visit, video technology does not allow for your provider to perform a traditional examination. This may limit your provider's ability to fully assess your condition. If your provider identifies any concerns that need to be evaluated in person or the need to arrange testing (such as labs, EKG, etc.), we will make arrangements to do so. Although advances in technology are sophisticated, we cannot ensure that it will always work on either your end or our end. If the connection with a video visit is poor, the visit may have to be switched to a telephone visit. With either a video or telephone visit, we are not always able to ensure that we have a secure connection.  By engaging in this virtual visit, you consent to the provision of healthcare and authorize for your insurance to be billed (if applicable) for the services provided during this visit. Depending on your insurance coverage, you may receive a charge related to this service.  I need to obtain your verbal consent now. Are you willing to proceed with your visit today? [name] has provided verbal consent on 07/04/2023 for a virtual visit (video or telephone). [name], PA-C  Date: 07/04/2023 9:48 AM   Virtual Visit via Video Note   I, [name], connected with  [name]  ([id number], 05-Mar-1980) on 07/04/23 at  9:45 AM EDT by a video-enabled telemedicine application and verified that I am speaking with the correct person using two identifiers.  Location: Patient: Virtual Visit  Location Patient: Mobile Provider: Virtual Visit Location Provider: Home Office   I discussed the limitations of evaluation and management by telemedicine and the availability of in person appointments. The patient expressed understanding and agreed to proceed.    History of Present Illness: [name] is a 43 y.o. who identifies as a female who was assigned female at birth, and is being seen today for vaginal discharge.  HPI: Vaginal Discharge The patient's primary symptoms include genital itching, a genital odor and vaginal discharge. This is a new problem. The current episode started 1 to 4 weeks ago (a couple weeks). The problem occurs constantly. The problem has been unchanged. The pain is mild. Pertinent negatives include no chills, dysuria, fever, flank pain, nausea or vomiting. The vaginal discharge was malodorous, white and milky. There has been no bleeding. She has not been passing clots. She has not been passing tissue.     Problems: There are no active problems to display for this patient.   Allergies:  Allergies  Allergen Reactions   Ibuprofen Other (See Comments)    Burning stomach Burning stomach    Sulfa Antibiotics Hives   Medications:  Current Outpatient Medications:    fluconazole  (DIFLUCAN ) 150 MG tablet, Take 1 tablet (150 mg total) by mouth every 3 (three) days as needed., Disp: 3 tablet, Rfl: 0   metroNIDAZOLE  (METROGEL ) 0.75 % vaginal gel, Place 1 Applicatorful vaginally at bedtime for 7 days., Disp: 70 g, Rfl: 0   Acetaminophen  (TYLENOL  PO), Take by  mouth., Disp: , Rfl:    esomeprazole (NEXIUM) 40 MG capsule, Take 40 mg by mouth daily before breakfast.   (Patient not taking: Reported on 10/13/2020), Disp: , Rfl:    HYDROcodone -acetaminophen  (NORCO/VICODIN) 5-325 MG tablet, Take 1 tablet by mouth every 6 (six) hours as needed for moderate pain., Disp: 5 tablet, Rfl: 0   levonorgestrel (MIRENA) 20 MCG/24HR IUD, 1 each by Intrauterine route once., Disp: , Rfl:     metroNIDAZOLE  (METROGEL ) 0.75 % gel, Apply 1 Application topically 2 (two) times daily. Use for 5 days., Disp: 45 g, Rfl: 0   Misc. Devices (VAGINAL SUPPOSITORY APPLICATOR) MISC, 1 applicator by Does not apply route 2 (two) times daily., Disp: 1 each, Rfl: 0   nystatin  (MYCOSTATIN ) 100000 UNIT/ML suspension, Take 5 mLs (500,000 Units total) by mouth 4 (four) times daily., Disp: 120 mL, Rfl: 0   omeprazole (PRILOSEC) 20 MG capsule, Take 20 mg by mouth daily., Disp: , Rfl:    oxyCODONE -acetaminophen  (ROXICET) 5-325 MG tablet, Take 1 tablet by mouth every 6 (six) hours as needed for moderate pain., Disp: 12 tablet, Rfl: 0  Observations/Objective: Patient is well-developed, well-nourished in no acute distress.  Resting comfortably at home.  Head is normocephalic, atraumatic.  No labored breathing.  Speech is clear and coherent with logical content.  Patient is alert and oriented at baseline.    Assessment and Plan: 1. BV (bacterial vaginosis) (Primary) - metroNIDAZOLE  (METROGEL ) 0.75 % vaginal gel; Place 1 Applicatorful vaginally at bedtime for 7 days.  Dispense: 70 g; Refill: 0  2. Antibiotic-induced yeast infection - fluconazole  (DIFLUCAN ) 150 MG tablet; Take 1 tablet (150 mg total) by mouth every 3 (three) days as needed.  Dispense: 3 tablet; Refill: 0  - Symptoms consistent with BV - Metrogel  prescribed - Limit bubble baths, scented lotions/soaps/detergents - Limit tight fitting clothing - Diflucan  given as prophylaxis as patient tends to get vaginal yeast infections with antibiotic use - Seek on person evaluation if not improving or if symptoms worsen   Follow Up Instructions: I discussed the assessment and treatment plan with the patient. The patient was provided an opportunity to ask questions and all were answered. The patient agreed with the plan and demonstrated an understanding of the instructions.  A copy of instructions were sent to the patient via MyChart unless otherwise  noted below.    The patient was advised to call back or seek an in-person evaluation if the symptoms worsen or if the condition fails to improve as anticipated.    [name], PA-C

## 2023-07-04 NOTE — Patient Instructions (Signed)
 [name], thank you for joining [name] Kelp, PA-C for today's virtual visit.  While this provider is not your primary care provider (PCP), if your PCP is located in our provider database this encounter information will be shared with them immediately following your visit.   A [facility] MyChart account gives you access to today's visit and all your visits, tests, and labs performed at [facility]  click here if you don't have a [facility] MyChart account or go to [email or web address]  Consent: (Patient) [name] provided verbal consent for this virtual visit at the beginning of the encounter.  Current Medications:  Current Outpatient Medications:    fluconazole  (DIFLUCAN ) 150 MG tablet, Take 1 tablet (150 mg total) by mouth every 3 (three) days as needed., Disp: 3 tablet, Rfl: 0   metroNIDAZOLE  (METROGEL ) 0.75 % vaginal gel, Place 1 Applicatorful vaginally at bedtime for 7 days., Disp: 70 g, Rfl: 0   Acetaminophen  (TYLENOL  PO), Take by mouth., Disp: , Rfl:    esomeprazole (NEXIUM) 40 MG capsule, Take 40 mg by mouth daily before breakfast.   (Patient not taking: Reported on 10/13/2020), Disp: , Rfl:    HYDROcodone -acetaminophen  (NORCO/VICODIN) 5-325 MG tablet, Take 1 tablet by mouth every 6 (six) hours as needed for moderate pain., Disp: 5 tablet, Rfl: 0   levonorgestrel (MIRENA) 20 MCG/24HR IUD, 1 each by Intrauterine route once., Disp: , Rfl:    metroNIDAZOLE  (METROGEL ) 0.75 % gel, Apply 1 Application topically 2 (two) times daily. Use for 5 days., Disp: 45 g, Rfl: 0   Misc. Devices (VAGINAL SUPPOSITORY APPLICATOR) MISC, 1 applicator by Does not apply route 2 (two) times daily., Disp: 1 each, Rfl: 0   nystatin  (MYCOSTATIN ) 100000 UNIT/ML suspension, Take 5 mLs (500,000 Units total) by mouth 4 (four) times daily., Disp: 120 mL, Rfl: 0   omeprazole (PRILOSEC) 20 MG capsule, Take 20 mg by mouth daily., Disp: , Rfl:    oxyCODONE -acetaminophen  (ROXICET)  5-325 MG tablet, Take 1 tablet by mouth every 6 (six) hours as needed for moderate pain., Disp: 12 tablet, Rfl: 0   Medications ordered in this encounter:  Meds ordered this encounter  Medications   metroNIDAZOLE  (METROGEL ) 0.75 % vaginal gel    Sig: Place 1 Applicatorful vaginally at bedtime for 7 days.    Dispense:  70 g    Refill:  0    Supervising Provider:   Corine [name] [id number]   fluconazole  (DIFLUCAN ) 150 MG tablet    Sig: Take 1 tablet (150 mg total) by mouth every 3 (three) days as needed.    Dispense:  3 tablet    Refill:  0    Supervising Provider:   LAMPTEY, PHILIP [name] [[id number]]     *If you need refills on other medications prior to your next appointment, please contact your pharmacy*  Follow-Up: Call back or seek an in-person evaluation if the symptoms worsen or if the condition fails to improve as anticipated.  [facility] [phone]  Other Instructions  Vaginal Infection (Bacterial Vaginosis): What to Know  Bacterial vaginosis is an infection of the vagina. It happens when the balance of normal germs (bacteria) in the vagina changes. It's common among females ages 63 to 43. If left untreated, it can increase your risk of getting a sexually transmitted infection (STI). If you're pregnant, you need to get treated right away. This infection can cause a baby to be born early or at a low birth weight. What  are the causes? This happens when too many harmful germs grow in the vagina. The exact reason why this happens isn't known. You can't get this infection from toilet seats, bedding, swimming pools, or contact with objects around you. What increases the risk? Having new or multiple sexual partners, or unprotected sex. Douching. Using an intrauterine device (IUD). Smoking. Alcohol and drug abuse. Taking certain antibiotics. Being pregnant. You can get a vaginal infection without being sexually active. However, it most often occurs in sexually  active females. What are the signs or symptoms? Some females have no symptoms. If you have symptoms, they may include: [name] or white vaginal discharge. It can be watery or foamy. A fish-like smell, especially after sex or during your menstrual period. Itching in and around the vagina. Burning or pain with peeing. How is this diagnosed? This infection is diagnosed based on: Your medical history. A physical exam of the vagina. Checking a sample of vaginal fluid for harmful bacteria or uncommon cells. How is this treated? This condition is treated with antibiotics. These may be given as: A pill. A cream for your vagina. A medicine that you put into your vagina called a suppository. If the infection comes back, you may need more antibiotics. Follow these instructions at home: Medicines Take your medicines only as told. Take or apply your antibiotics as told. Do not stop using them even if you start to feel better. General instructions If you have a female sexual partner, tell her about the infection. She should see her health care provider. Female partners don't need treatment. Avoid sex until treatment is complete. Drink more fluids as told. Keep the area around your vagina and rectum clean. Wash the area daily with warm water. Wipe yourself from front to back after pooping. If you're breastfeeding, talk to your provider about continuing during treatment. How is this prevented? Self-care Do not douche or use vaginal deodorant sprays. Douching can upset the balance of good and harmful bacteria in the vagina, which can cause an infection to happen again. Wear cotton or cotton-lined underwear. Avoid wearing tight pants or pantyhose, especially in the summer. Safe sex Use condoms correctly and every time you have sex. Use dental dams to protect yourself during oral sex. Limit the number of sexual partners. Get tested for STIs. Your sexual partner should also get tested. Drugs and  alcohol Do not smoke, vape, or use nicotine or tobacco. Do not use drugs. Limit the amount of alcohol you drink because it can lead to risky sexual behavior. Where to find more information To learn more: Go to TonerPromos.no. Click Health Topics A-Z. Type bacterial vaginosis in the search box. American Sexual Health Association (ASHA): ashasexualhealth.org U.S. Department of Health and Health and safety inspector, Office on Women's Health: TravelLesson.ca Contact a health care provider if: Your symptoms don't get better, even after treatment. You have more discharge or pain when peeing. You have a fever or chills. You have pain in your belly or pelvis. You have pain during sex. You have vaginal bleeding between menstrual periods. This information is not intended to replace advice given to you by your health care provider. Make sure you discuss any questions you have with your health care provider. Document Revised: 06/26/2022 Document Reviewed: 06/26/2022 Elsevier Patient Education  2024 Elsevier Inc.   If you have been instructed to have an in-person evaluation today at a local Urgent Care facility, please use the link below. It will take you to a list of all of our available  [facility], including address, phone number and hours of operation. Please do not delay care.  [facility]  If you or a family member do not have a primary care provider, use the link below to schedule a visit and establish care. When you choose a [facility] primary care physician or advanced practice provider, you gain a long-term partner in health. Find a Primary Care Provider  Learn more about [facility]'s in-office and virtual care options: [facility] - Get Care Now

## 2023-12-02 ENCOUNTER — Encounter: Payer: Self-pay | Admitting: Family Medicine

## 2023-12-02 ENCOUNTER — Ambulatory Visit (INDEPENDENT_AMBULATORY_CARE_PROVIDER_SITE_OTHER): Admitting: Family Medicine

## 2023-12-02 VITALS — BP 112/78 | HR 78 | Resp 16 | Ht 65.0 in | Wt 234.6 lb

## 2023-12-02 DIAGNOSIS — Z7689 Persons encountering health services in other specified circumstances: Secondary | ICD-10-CM

## 2023-12-02 DIAGNOSIS — R2243 Localized swelling, mass and lump, lower limb, bilateral: Secondary | ICD-10-CM

## 2023-12-02 DIAGNOSIS — R5383 Other fatigue: Secondary | ICD-10-CM | POA: Diagnosis not present

## 2023-12-02 DIAGNOSIS — Z131 Encounter for screening for diabetes mellitus: Secondary | ICD-10-CM

## 2023-12-02 DIAGNOSIS — J301 Allergic rhinitis due to pollen: Secondary | ICD-10-CM

## 2023-12-02 DIAGNOSIS — K219 Gastro-esophageal reflux disease without esophagitis: Secondary | ICD-10-CM

## 2023-12-02 DIAGNOSIS — N951 Menopausal and female climacteric states: Secondary | ICD-10-CM

## 2023-12-02 NOTE — Progress Notes (Unsigned)
 New Patient Office Visit  Subjective   Patient ID: [name], female    DOB: November 11, 1980  Age: 43 y.o. MRN: [id number]  CC:  Chief Complaint  Patient presents with   Establish Care    Wants labs has little energy thinks Menapause may be to blame- check hormone labs has Grandbaby on way to help raise as child is 16 pregnant at home.   HPI [name] is a 43 year old female who presents to establish with [facility].   CC: Patient here to establish care  Last PCP: [facility] Specialists: OBGYN  PMHx: arthritis, COPD, GERD, thyroid disease  Leaky valve  Heart flutters  Denies shob, dizziness (room is moving),  Some lightheadedness   No energy- worse over the past year  Wants to take a nap every day  Major life stressors- husband had a stroke, only one working  Outpatient Encounter Medications as of 12/02/2023  Medication Sig   Acetaminophen  (TYLENOL  PO) Take by mouth.   cetirizine (ZYRTEC) 5 MG chewable tablet Chew 5 mg by mouth daily.   esomeprazole (NEXIUM) 40 MG capsule Take 40 mg by mouth daily before breakfast.     [DISCONTINUED] fluconazole  (DIFLUCAN ) 150 MG tablet Take 1 tablet (150 mg total) by mouth every 3 (three) days as needed.   [DISCONTINUED] HYDROcodone -acetaminophen  (NORCO/VICODIN) 5-325 MG tablet Take 1 tablet by mouth every 6 (six) hours as needed for moderate pain.   [DISCONTINUED] levonorgestrel (MIRENA) 20 MCG/24HR IUD 1 each by Intrauterine route once.   [DISCONTINUED] metroNIDAZOLE  (METROGEL ) 0.75 % gel Apply 1 Application topically 2 (two) times daily. Use for 5 days.   [DISCONTINUED] Misc. Devices (VAGINAL SUPPOSITORY APPLICATOR) MISC 1 applicator by Does not apply route 2 (two) times daily.   [DISCONTINUED] nystatin  (MYCOSTATIN ) 100000 UNIT/ML suspension Take 5 mLs (500,000 Units total) by mouth 4 (four) times daily.   [DISCONTINUED] omeprazole (PRILOSEC) 20 MG capsule Take 20 mg by mouth daily.    [DISCONTINUED] oxyCODONE -acetaminophen  (ROXICET) 5-325 MG tablet Take 1 tablet by mouth every 6 (six) hours as needed for moderate pain.   No facility-administered encounter medications on file as of 12/02/2023.   There are no active problems to display for this patient.  Past Medical History:  Diagnosis Date   Abnormal Pap smear    Allergy    Anxiety    Arthritis    COPD (chronic obstructive pulmonary disease) (HCC)    GERD (gastroesophageal reflux disease)    Substance abuse (HCC)    Marijuana   Thyroid disease    Past Surgical History:  Procedure Laterality Date   CESAREAN SECTION      X 3   DILATION AND CURETTAGE OF UTERUS  2004/2007/2009   DILATION AND EVACUATION     LEEP     TUBAL LIGATION     TUBAL LIGATION  06/12/10   WISDOM TOOTH EXTRACTION     Family History  Problem Relation Age of Onset   Arthritis Mother    Anxiety disorder Mother    Arthritis Maternal Grandmother    Social History   Socioeconomic History   Marital status: Married    Spouse name: Not on file   Number of children: 3   Years of education: Not on file   Highest education level: GED or equivalent  Occupational History   Not on file  Tobacco Use   Smoking status: Former    Current packs/day: 0.00    Types: Cigarettes  Quit date: 06/11/2010    Years since quitting: 13.4    Passive exposure: Never   Smokeless tobacco: Never  Vaping Use   Vaping status: Some Days   Substances: Nicotine, Flavoring  Substance and Sexual Activity   Alcohol use: No    Comment: none   Drug use: Yes    Types: Marijuana    Comment: daily   Sexual activity: Yes    Partners: Male    Birth control/protection: Surgical  Other Topics Concern   Not on file  Social History Narrative   Not on file   Social Drivers of Health   Financial Resource Strain: Low Risk  (12/01/2023)   Overall Financial Resource Strain (CARDIA)    Difficulty of Paying Living Expenses: Not very hard  Food Insecurity: No Food  Insecurity (12/01/2023)   Hunger Vital Sign    Worried About Running Out of Food in the Last Year: Never true    Ran Out of Food in the Last Year: Never true  Transportation Needs: No Transportation Needs (12/01/2023)   PRAPARE - Administrator, Civil Service (Medical): No    Lack of Transportation (Non-Medical): No  Physical Activity: Sufficiently Active (12/01/2023)   Exercise Vital Sign    Days of Exercise per Week: 5 days    Minutes of Exercise per Session: 150+ min  Stress: Stress Concern Present (12/01/2023)   [employer] of Occupational Health - Occupational Stress Questionnaire    Feeling of Stress: To some extent  Social Connections: Socially Isolated (12/01/2023)   Social Connection and Isolation Panel    Frequency of Communication with Friends and Family: Once a week    Frequency of Social Gatherings with Friends and Family: Once a week    Attends Religious Services: Never    Database Administrator or Organizations: No    Attends Engineer, Structural: Not on file    Marital Status: Married  Catering Manager Violence: Not At Risk (10/15/2021)   Humiliation, Afraid, Rape, and Kick questionnaire    Fear of Current or Ex-Partner: No    Emotionally Abused: No    Physically Abused: No    Sexually Abused: No   Outpatient Medications Prior to Visit  Medication Sig Dispense Refill   Acetaminophen  (TYLENOL  PO) Take by mouth.     cetirizine (ZYRTEC) 5 MG chewable tablet Chew 5 mg by mouth daily.     esomeprazole (NEXIUM) 40 MG capsule Take 40 mg by mouth daily before breakfast.       fluconazole  (DIFLUCAN ) 150 MG tablet Take 1 tablet (150 mg total) by mouth every 3 (three) days as needed. 3 tablet 0   HYDROcodone -acetaminophen  (NORCO/VICODIN) 5-325 MG tablet Take 1 tablet by mouth every 6 (six) hours as needed for moderate pain. 5 tablet 0   levonorgestrel (MIRENA) 20 MCG/24HR IUD 1 each by Intrauterine route once.     metroNIDAZOLE  (METROGEL ) 0.75 % gel  Apply 1 Application topically 2 (two) times daily. Use for 5 days. 45 g 0   Misc. Devices (VAGINAL SUPPOSITORY APPLICATOR) MISC 1 applicator by Does not apply route 2 (two) times daily. 1 each 0   nystatin  (MYCOSTATIN ) 100000 UNIT/ML suspension Take 5 mLs (500,000 Units total) by mouth 4 (four) times daily. 120 mL 0   omeprazole (PRILOSEC) 20 MG capsule Take 20 mg by mouth daily.     oxyCODONE -acetaminophen  (ROXICET) 5-325 MG tablet Take 1 tablet by mouth every 6 (six) hours as needed for moderate pain. 12 tablet  0   No facility-administered medications prior to visit.   Allergies  Allergen Reactions   Ibuprofen Other (See Comments)    Burning stomach Burning stomach    Sulfa Antibiotics Hives   Statins Other (See Comments)    Myopathy   ROS: see HPI    Objective   Today's Vitals   12/02/23 1318  BP: 112/78  Pulse: 78  Resp: 16  SpO2: 98%  Weight: 234 lb 9.6 oz (106.4 kg)  Height: 5' 5 (1.651 m)  PainSc: 0-No pain   Physical Exam Vitals reviewed.  Constitutional:      Appearance: Normal appearance.  Cardiovascular:     Rate and Rhythm: Normal rate and regular rhythm.     Pulses: Normal pulses.     Heart sounds: Normal heart sounds.  Pulmonary:     Effort: Pulmonary effort is normal.     Breath sounds: Normal breath sounds.  Musculoskeletal:     Right lower leg: Edema present.     Left lower leg: Edema present.  Neurological:     Mental Status: She is alert.  Psychiatric:        Mood and Affect: Mood normal.        Behavior: Behavior normal.     Assessment & Plan:     Return in about 6 weeks (around 01/13/2024).   Evalene Arts, FNP

## 2023-12-02 NOTE — Patient Instructions (Signed)
 MyChart:  For all urgent or time sensitive needs we ask that you please call the office to avoid delays. Our number is [phone]) [phone]. MyChart is not constantly monitored and due to the large volume of messages a day, replies may take up to 72 business hours.   MyChart Policy: MyChart allows for you to see your visit notes, after visit summary, provider recommendations, lab and tests results, make an appointment, request refills, and contact your provider or the office for non-urgent questions or concerns. Providers are seeing patients during normal business hours and do not have built in time to review MyChart messages.  We ask that you allow a minimum of 3 business days for responses to [employer]. For this reason, please do not send urgent requests through MyChart. Please call the office at [phone]. New and ongoing conditions may require a visit. We have virtual and in person visit available for your convenience.  Complex MyChart concerns may require a visit. Your provider may request you schedule a virtual or in person visit to ensure we are providing the best care possible. MyChart messages sent after 11:00 AM on Friday will not be received by the provider until Monday morning.    Lab and Test Results: You will receive your lab and test results on MyChart as soon as they are completed and results have been sent by the lab or testing facility. Due to this service, you will receive your results BEFORE your provider.  I review lab and tests results each morning prior to seeing patients. Some results require collaboration with other providers to ensure you are receiving the most appropriate care. For this reason, we ask that you please allow a minimum of 3-5 business days from the time the ALL results have been received for your provider to receive and review lab and test results and contact you about these.  Most lab and test result comments from the provider will be sent through  MyChart. Your provider may recommend changes to the plan of care, follow-up visits, repeat testing, ask questions, or request an office visit to discuss these results. You may reply directly to this message or call the office at [phone] to provide information for the provider or set up an appointment. In some instances, you will be called with test results and recommendations. Please let us  know if this is preferred and we will make note of this in your chart to provide this for you.    If you have not heard a response to your lab or test results in 5 business days from all results returning to MyChart, please call the office to let us  know. We ask that you please avoid calling prior to this time unless there is an emergent concern. Due to high call volumes, this can delay the resulting process.   After Hours: For all non-emergency after hours needs, please call the office at [phone] and select the option to reach the on-call provider service. On-call services are shared between multiple [place] offices and therefore it will not be possible to speak directly with your provider. On-call providers may provide medical advice and recommendations, but are unable to provide refills for maintenance medications.  For all emergency or urgent medical needs after normal business hours, we recommend that you seek care at the closest Urgent Care or Emergency Department to ensure appropriate treatment in a timely manner.  [facility] [place] has a 24 hour emergency room located on the ground  floor for your convenience.    Urgent Concerns During the Business Day Providers are seeing patients from 8AM to 5PM, Monday through Thursday, and 8AM to 12PM on Friday with a busy schedule and are most often not able to respond to non-urgent calls until the end of the day or the next business day. If you should have URGENT concerns during the day, please call and speak to the nurse or schedule a  same day appointment so that we can address your concern without delay.    Thank you, again, for choosing me as your health care partner. I appreciate your trust and look forward to learning more about you.    [name] Arts, FNP-C

## 2023-12-15 ENCOUNTER — Other Ambulatory Visit: Payer: Self-pay | Admitting: Family Medicine

## 2023-12-15 MED ORDER — VITAMIN D (ERGOCALCIFEROL) 1.25 MG (50000 UNIT) PO CAPS: 50000.0000 [IU] | ORAL_CAPSULE | ORAL | 0 refills | Status: AC

## 2023-12-17 ENCOUNTER — Encounter: Payer: Self-pay | Admitting: Family Medicine

## 2023-12-22 LAB — CBC WITH DIFFERENTIAL/PLATELET
Basophils Absolute: 0 x10E3/uL (ref 0.0–0.2)
Basos: 0 %
EOS (ABSOLUTE): 0.2 x10E3/uL (ref 0.0–0.4)
Eos: 2 %
Hematocrit: 35.2 % (ref 34.0–46.6)
Hemoglobin: 11.5 g/dL (ref 11.1–15.9)
Immature Grans (Abs): 0 x10E3/uL (ref 0.0–0.1)
Immature Granulocytes: 0 %
Lymphocytes Absolute: 1.9 x10E3/uL (ref 0.7–3.1)
Lymphs: 23 %
MCH: 28.6 pg (ref 26.6–33.0)
MCHC: 32.7 g/dL (ref 31.5–35.7)
MCV: 88 fL (ref 79–97)
Monocytes Absolute: 0.5 x10E3/uL (ref 0.1–0.9)
Monocytes: 6 %
Neutrophils Absolute: 5.4 x10E3/uL (ref 1.4–7.0)
Neutrophils: 69 %
Platelets: 291 x10E3/uL (ref 150–450)
RBC: 4.02 x10E6/uL (ref 3.77–5.28)
RDW: 13.3 % (ref 11.7–15.4)
WBC: 8 x10E3/uL (ref 3.4–10.8)

## 2023-12-22 LAB — COMPREHENSIVE METABOLIC PANEL WITH GFR
ALT: 10 IU/L (ref 0–32)
AST: 12 IU/L (ref 0–40)
Albumin: 4.3 g/dL (ref 3.9–4.9)
Alkaline Phosphatase: 98 IU/L (ref 41–116)
BUN/Creatinine Ratio: 24 — ABNORMAL HIGH (ref 9–23)
BUN: 15 mg/dL (ref 6–24)
Bilirubin Total: <0.2 mg/dL (ref 0.0–1.2)
CO2: 23 mmol/L (ref 20–29)
Calcium: 8.5 mg/dL — ABNORMAL LOW (ref 8.7–10.2)
Chloride: 102 mmol/L (ref 96–106)
Creatinine, Ser: 0.63 mg/dL (ref 0.57–1.00)
Globulin, Total: 2.8 g/dL (ref 1.5–4.5)
Glucose: 92 mg/dL (ref 70–99)
Potassium: 4.2 mmol/L (ref 3.5–5.2)
Sodium: 138 mmol/L (ref 134–144)
Total Protein: 7.1 g/dL (ref 6.0–8.5)
eGFR: 112 mL/min/1.73 (ref >59)

## 2023-12-22 LAB — HORMONE PANEL (T4,TSH,FSH,TESTT,SHBG,DHEA,ETC)
DHEA-Sulfate, LCMS: 101 ug/dL
Estradiol, Serum, MS: 41 pg/mL
Estrone Sulfate: 133 ng/dL
Follicle Stimulating Hormone: 9.8 m[IU]/mL
Free T-3: 3 pg/mL
Free Testosterone, Serum: 1.6 pg/mL
Progesterone, Serum: <10 ng/dL
Sex Hormone Binding Globulin: 45.5 nmol/L
T4: 9.2 ug/dL
TSH: 1.2 uU/mL
Testosterone, Serum (Total): 12 ng/dL
Testosterone-% Free: 1.3 %
Triiodothyronine (T-3), Serum: 120 ng/dL

## 2023-12-22 LAB — HEMOGLOBIN A1C
Est. average glucose Bld gHb Est-mCnc: 114 mg/dL
Hgb A1c MFr Bld: 5.6 % (ref 4.8–5.6)

## 2023-12-22 LAB — IRON,TIBC AND FERRITIN PANEL
Ferritin: 13 ng/mL — ABNORMAL LOW (ref 15–150)
Iron Saturation: 13 % — ABNORMAL LOW (ref 15–55)
Iron: 51 ug/dL (ref 27–159)
Total Iron Binding Capacity: 405 ug/dL (ref 250–450)
UIBC: 354 ug/dL (ref 131–425)

## 2023-12-22 LAB — VITAMIN D 25 HYDROXY (VIT D DEFICIENCY, FRACTURES): Vit D, 25-Hydroxy: 18.8 ng/mL — ABNORMAL LOW (ref 30.0–100.0)

## 2023-12-22 LAB — VITAMIN B12: Vitamin B-12: 370 pg/mL (ref 232–1245)

## 2023-12-29 ENCOUNTER — Ambulatory Visit: Payer: Self-pay | Admitting: Family Medicine

## 2024-01-09 ENCOUNTER — Ambulatory Visit: Admitting: Family Medicine

## 2024-01-20 ENCOUNTER — Encounter: Payer: Self-pay | Admitting: Family Medicine

## 2024-01-20 ENCOUNTER — Ambulatory Visit (INDEPENDENT_AMBULATORY_CARE_PROVIDER_SITE_OTHER): Admitting: Family Medicine

## 2024-01-20 VITALS — BP 119/82 | HR 79 | Resp 16 | Ht 65.0 in | Wt 233.0 lb

## 2024-01-20 DIAGNOSIS — R0601 Orthopnea: Secondary | ICD-10-CM | POA: Diagnosis not present

## 2024-01-20 DIAGNOSIS — K219 Gastro-esophageal reflux disease without esophagitis: Secondary | ICD-10-CM | POA: Diagnosis not present

## 2024-01-20 DIAGNOSIS — R0683 Snoring: Secondary | ICD-10-CM

## 2024-01-20 NOTE — Progress Notes (Signed)
 "  Established Patient Office Visit  Subjective  Patient ID: [name], female    DOB: 1980/09/12  Age: 43 y.o. MRN: [id number]  Chief Complaint  Patient presents with   Follow-up    Fatigue   Discussed the use of AI scribe software for clinical note transcription with the patient, who gave verbal consent to proceed.  History of Present Illness    [name] is a 43 year old female who presents for a follow-up regarding fatigue.  She has been experiencing fatigue that has progressively worsened over the past year, characterized by a lack of energy. She does experience excessive daytime sleepiness, especially when watching TV. She does not feel sleepy while driving, unless waking up very early. Her husband has noticed her snoring at night and periods of apnea.   She takes high-dose vitamin D  and iron supplements, which were started after previous lab work. She denies body weakness or fevers but notes a recent illness of unknown cause. She experiences shortness of breath with activity, such as walking or climbing stairs, and reports that while she can lie on her back, she cannot sleep on her back and feels she cannot breathe as well in that position. She has occasional swelling in her lower legs, which worsens with prolonged standing, heat, and long car rides, sometimes requiring compression socks. Denies chest pain, weakness, bloody or dark stools, arthralgia, and unintentional weight loss.   She has a history of heartburn, which has been worsening despite taking an over-the-counter equivalent of Nexium at a prescription dose of 40 mg in the morning, and sometimes an additional dose at night. She has not seen a gastroenterologist in over a year but has had both upper endoscopy and lower colonoscopy in the past.  Her social history is significant for a stressful year, including her husband's stroke in February and her daughter's traumatic experience in July, leading to increased  stress and occasional nightmares. She has two pregnant daughters, one aged 29 and the other 15, adding to her stress. She works early hours, often waking up at 4 or 5 AM, and values her sleep routine.         01/20/2024    1:05 PM 12/02/2023    1:24 PM  GAD 7 : Generalized Anxiety Score  Nervous, Anxious, on Edge 1 2  Control/stop worrying 1 2  Worry too much - different things 1 2  Trouble relaxing 0 0  Restless 0 0  Easily annoyed or irritable 1 1  Afraid - awful might happen 0 0  Total GAD 7 Score 4 7  Anxiety Difficulty  Not difficult at all      01/20/2024    1:04 PM 12/02/2023    1:24 PM  PHQ9 SCORE ONLY  PHQ-9 Total Score 2 1   ROS: see HPI     Objective:    BP 119/82   Pulse 79   Resp 16   Ht 5' 5 (1.651 m)   Wt 233 lb (105.7 kg)   LMP 01/01/2024   SpO2 98%   BMI 38.77 kg/m  BP Readings from Last 3 Encounters:  01/20/24 119/82  12/02/23 112/78  03/08/22 (!) 132/91    Physical Exam Vitals reviewed.  Constitutional:      Appearance: Normal appearance.  Cardiovascular:     Rate and Rhythm: Normal rate and regular rhythm.     Pulses: Normal pulses.     Heart sounds: Normal heart sounds.  Pulmonary:  Effort: Pulmonary effort is normal.     Breath sounds: Normal breath sounds.  Musculoskeletal:     Right lower leg: No edema.     Left lower leg: No edema.  Neurological:     Mental Status: She is alert.  Psychiatric:        Mood and Affect: Mood normal.        Behavior: Behavior normal.     Assessment & Plan:   1. Orthopnea (Primary) Patient presents with dyspnea on exertion, peripheral edema, orthopnea, and fatigue. Patient in no acute distress and is well-appearing. Denies chest pain, heart palpitations, vision changes, and headaches. Cardiovascular exam with heart regular rate and rhythm. Normal heart sounds, no murmurs present. No lower extremity edema present at visit today. Lungs clear to auscultation bilaterally. Will obtain echo at this  time, due to possible history of a leaky valve and current symptoms, despite no murmurs present during exam.  - ECHOCARDIOGRAM COMPLETE; Future  2. Snoring Snoring and daytime fatigue suggestive of possible obstructive sleep apnea. Epworth Sleepiness Scale with score of 14. Discussed CPAP therapy benefits and potential use of Zepbound for weight loss if apnea confirmed. Referred to pulmonology for sleep study evaluation. - Ambulatory referral to Pulmonology  3. Gastroesophageal reflux disease, unspecified whether esophagitis present Frequent heartburn despite long-term Nexium use. Increased symptom frequency noted. Referred to GI specialist for further evaluation and management.  - Ambulatory referral to Gastroenterology   Return in about 3 months (around 04/19/2024) for chronic conditions .    Evalene Arts, FNP "

## 2024-01-20 NOTE — Patient Instructions (Signed)

## 2024-01-28 ENCOUNTER — Telehealth: Payer: Self-pay | Admitting: *Deleted

## 2024-01-28 NOTE — Telephone Encounter (Signed)
 Copied from CRM [id number]. Topic: Referral - Status >> Jan 28, 2024  3:34 PM Mesmerise C wrote: Reason for CRM: Patient stated she received a call about scheduling her echocardiogram but stated due to insurance needing Dr. Towana to send over approval to her insurance before she can be scheduled

## 2024-01-30 ENCOUNTER — Ambulatory Visit: Admitting: Sleep Medicine

## 2024-01-30 ENCOUNTER — Encounter: Payer: Self-pay | Admitting: Sleep Medicine

## 2024-01-30 VITALS — BP 100/80 | HR 80 | Temp 98.0°F | Ht 65.0 in | Wt 235.2 lb

## 2024-01-30 DIAGNOSIS — G4710 Hypersomnia, unspecified: Secondary | ICD-10-CM | POA: Diagnosis not present

## 2024-01-30 DIAGNOSIS — Z6839 Body mass index (BMI) 39.0-39.9, adult: Secondary | ICD-10-CM | POA: Diagnosis not present

## 2024-01-30 DIAGNOSIS — G4733 Obstructive sleep apnea (adult) (pediatric): Secondary | ICD-10-CM

## 2024-01-30 DIAGNOSIS — Z87891 Personal history of nicotine dependence: Secondary | ICD-10-CM

## 2024-01-30 DIAGNOSIS — E669 Obesity, unspecified: Secondary | ICD-10-CM | POA: Diagnosis not present

## 2024-01-30 DIAGNOSIS — R0683 Snoring: Secondary | ICD-10-CM

## 2024-01-30 NOTE — Patient Instructions (Signed)
 [name]

## 2024-01-30 NOTE — Telephone Encounter (Signed)
 Message sent to pt.

## 2024-01-30 NOTE — Progress Notes (Signed)
 "      Name:[name] MRN: [id number] DOB: 1980-06-29   CHIEF COMPLAINT:  EXCESSIVE DAYTIME SLEEPINESS   HISTORY OF PRESENT ILLNESS: [name] is a 44 y.o. w/ a h/o GERD and obesity who presents for c/o loud snoring and excessive daytime sleepiness which has been present for several years. Reports nocturnal awakenings due to unclear reasons and occasionally has difficulty falling back to sleep. Reports significant weight changes. Admits to dry mouth, night sweats and morning headaches. Denies RLS symptoms, dream enactment, cataplexy, hypnagogic or hypnapompic hallucinations. Denies a family history of sleep apnea. Denies drowsy driving. Drinks 1 cup of coffee and 3-4 glasses of tea daily, denies alcohol or tobacco use, smokes marijuana a few times per week.   Bedtime 8-10 pm Sleep onset 30 mins Rise time 4-6 am   EPWORTH SLEEP SCORE 15    01/30/2024   10:00 AM 01/20/2024    1:15 PM  Results of the Epworth flowsheet  Sitting and reading 3 2  Watching TV 3 3  Sitting, inactive in a public place (e.g. a theatre or a meeting) 2 2  As a passenger in a car for an hour without a break 2 2  Lying down to rest in the afternoon when circumstances permit 3 3  Sitting and talking to someone 0 0  Sitting quietly after a lunch without alcohol 2 2  In a car, while stopped for a few minutes in traffic 0 0  Total score 15 14    PAST MEDICAL HISTORY :   has a past medical history of Abnormal Pap smear, Allergy, Anxiety, Arthritis, COPD (chronic obstructive pulmonary disease) (HCC), GERD (gastroesophageal reflux disease), Substance abuse (HCC), and Thyroid disease.  has a past surgical history that includes Dilation and evacuation; Wisdom tooth extraction; Tubal ligation; LEEP; Tubal ligation (06/12/10); Cesarean section; and Dilation and curettage of uterus (2004/2007/2009). Prior to Admission medications  Medication Sig Start Date End Date Taking? Authorizing Provider  Acetaminophen   (TYLENOL  PO) Take by mouth.    [provider]  cetirizine (ZYRTEC) 5 MG chewable tablet Chew 5 mg by mouth daily.    [provider]  esomeprazole (NEXIUM) 40 MG capsule Take 40 mg by mouth daily before breakfast.      [provider]  Vitamin D , Ergocalciferol , (DRISDOL ) 1.25 MG (50000 UNIT) CAPS capsule Take 1 capsule (50,000 Units total) by mouth every 7 (seven) days. 12/15/23   [name] Small, FNP   Allergies[1]  FAMILY HISTORY:  family history includes Anxiety disorder in her mother; Arthritis in her maternal grandmother and mother. SOCIAL HISTORY:  reports that she quit smoking about 13 years ago. Her smoking use included cigarettes. She has never been exposed to tobacco smoke. She has never used smokeless tobacco. She reports current drug use. Drug: Marijuana. She reports that she does not drink alcohol.   Review of Systems:  Gen:  Denies  fever, sweats, chills weight loss  HEENT: Denies blurred vision, double vision, ear pain, eye pain, hearing loss, nose bleeds, sore throat Cardiac:  No dizziness, chest pain or heaviness, chest tightness,edema, No JVD Resp:   No cough, -sputum production, -shortness of breath,-wheezing, -hemoptysis,  Gi: Denies swallowing difficulty, stomach pain, nausea or vomiting, diarrhea, constipation, bowel incontinence Gu:  Denies bladder incontinence, burning urine Ext:   Denies Joint pain, stiffness or swelling Skin: Denies  skin rash, easy bruising or bleeding or hives Endoc:  Denies polyuria, polydipsia , polyphagia or weight change Psych:   Denies depression,  insomnia or hallucinations  Other:  All other systems negative  VITAL SIGNS: BP 100/80   Pulse 80   Temp 98 F (36.7 C)   Ht 5' 5 (1.651 m)   Wt 235 lb 3.2 oz (106.7 kg)   LMP 01/01/2024   SpO2 97%   BMI 39.14 kg/m    Physical Examination:   General Appearance: No distress  EYES PERRLA, EOM intact.   NECK Supple, No JVD Pulmonary: normal breath  sounds, No wheezing.  CardiovascularNormal S1,S2.  No m/r/g.   Abdomen: Benign, Soft, non-tender. Skin:   warm, no rashes, no ecchymosis  Extremities: normal, no cyanosis, clubbing. Neuro:without focal findings,  speech normal  PSYCHIATRIC: Mood, affect within normal limits.   ASSESSMENT AND PLAN  OSA I suspect that OSA is likely present due to clinical presentation. Discussed the consequences of untreated sleep apnea. Advised not to drive drowsy for safety of patient and others. Will complete further evaluation with a home sleep study and follow up to review results.    Obesity Counseled patient on diet and lifestyle modification.    MEDICATION ADJUSTMENTS/LABS AND TESTS ORDERED: Recommend Sleep Study   Patient  satisfied with Plan of action and management. All questions answered  Follow up to review HST results and treatment plan.   I spent a total of 51 minutes reviewing chart data, face-to-face evaluation with the patient, counseling and coordination of care as detailed above.    [name], M.D.  Sleep Medicine [facility]           [1]  Allergies Allergen Reactions   Ibuprofen Other (See Comments)    Burning stomach Burning stomach    Sulfa Antibiotics Hives   Statins Other (See Comments)    Myopathy   "

## 2024-02-03 ENCOUNTER — Encounter: Payer: Self-pay | Admitting: Sleep Medicine

## 2024-02-03 DIAGNOSIS — E669 Obesity, unspecified: Secondary | ICD-10-CM

## 2024-02-03 DIAGNOSIS — G4733 Obstructive sleep apnea (adult) (pediatric): Secondary | ICD-10-CM

## 2024-02-04 NOTE — Telephone Encounter (Signed)
 [name], can you please help us  out with this? Please see mychart messages that have also been sent by pt.

## 2024-02-20 ENCOUNTER — Ambulatory Visit: Attending: Family Medicine

## 2024-02-20 DIAGNOSIS — R0601 Orthopnea: Secondary | ICD-10-CM | POA: Diagnosis present

## 2024-02-20 LAB — ECHOCARDIOGRAM COMPLETE
AR max vel: 2.9 cm2
AV Area VTI: 3.09 cm2
AV Area mean vel: 2.94 cm2
AV Mean grad: 6 mmHg
AV Peak grad: 11.3 mmHg
Ao pk vel: 1.68 m/s
Area-P 1/2: 3.83 cm2
S' Lateral: 3.37 cm

## 2024-02-23 ENCOUNTER — Ambulatory Visit: Payer: Self-pay | Admitting: Family Medicine

## 2024-02-26 ENCOUNTER — Ambulatory Visit

## 2024-02-28 ENCOUNTER — Ambulatory Visit

## 2024-03-23 ENCOUNTER — Ambulatory Visit: Admitting: Gastroenterology

## 2024-04-19 ENCOUNTER — Ambulatory Visit: Admitting: Family Medicine
